# Patient Record
Sex: Male | Born: 1955
Health system: Southern US, Community
[De-identification: ages and names within clinical notes are randomized; demographics above are authoritative.]

## PROBLEM LIST (undated history)

## (undated) DIAGNOSIS — U071 COVID-19: Secondary | ICD-10-CM

## (undated) DIAGNOSIS — J449 Chronic obstructive pulmonary disease, unspecified: Secondary | ICD-10-CM

## (undated) DIAGNOSIS — I2510 Atherosclerotic heart disease of native coronary artery without angina pectoris: Secondary | ICD-10-CM

## (undated) DIAGNOSIS — I509 Heart failure, unspecified: Secondary | ICD-10-CM

## (undated) DIAGNOSIS — E119 Type 2 diabetes mellitus without complications: Secondary | ICD-10-CM

## (undated) DIAGNOSIS — G4730 Sleep apnea, unspecified: Secondary | ICD-10-CM

## (undated) DIAGNOSIS — I10 Essential (primary) hypertension: Secondary | ICD-10-CM

## (undated) HISTORY — DX: Sleep apnea, unspecified: G47.30

## (undated) HISTORY — PX: CHOLECYSTECTOMY: SHX55

---

## 2006-05-26 ENCOUNTER — Ambulatory Visit: Payer: Self-pay | Admitting: Cardiovascular Disease

## 2006-05-27 ENCOUNTER — Ambulatory Visit: Payer: Self-pay | Admitting: Internal Medicine

## 2006-05-27 ENCOUNTER — Other Ambulatory Visit: Payer: Self-pay

## 2006-07-10 ENCOUNTER — Ambulatory Visit: Payer: Self-pay | Admitting: Orthopedic Surgery

## 2007-04-19 ENCOUNTER — Ambulatory Visit: Payer: Self-pay | Admitting: Gastroenterology

## 2007-10-04 ENCOUNTER — Emergency Department: Payer: Self-pay | Admitting: Emergency Medicine

## 2007-10-04 ENCOUNTER — Other Ambulatory Visit: Payer: Self-pay

## 2009-08-16 ENCOUNTER — Other Ambulatory Visit: Payer: Self-pay | Admitting: Nephrology

## 2009-08-22 ENCOUNTER — Ambulatory Visit: Payer: Self-pay | Admitting: Nephrology

## 2009-12-07 ENCOUNTER — Ambulatory Visit: Payer: Self-pay | Admitting: Cardiovascular Disease

## 2010-10-25 ENCOUNTER — Ambulatory Visit: Payer: Self-pay

## 2012-04-03 IMAGING — CT CT HEAD WITHOUT CONTRAST
2 series · 16 of 30 positions shown, 20 images · non-contrast
Comparison: none

REASON FOR EXAM: ADD ON CR  [id number]  severe HA
COMMENTS:

[Series 2: without · axial · non-contrast · 0.44mm/px · z∈[-188,-63]mm · 13 of 31 slices shown, 17 images]
[im 3/31  brain]
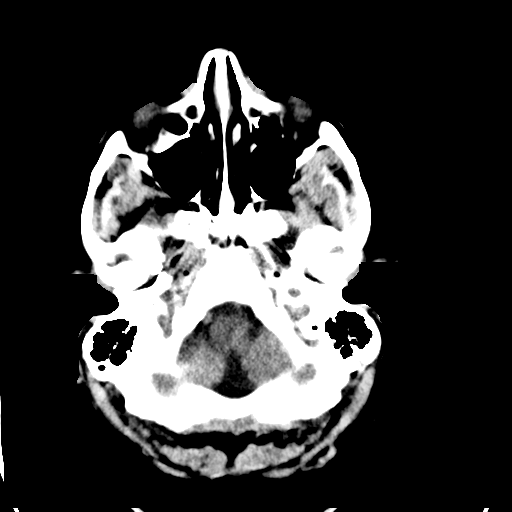
[im 3/31  bone]
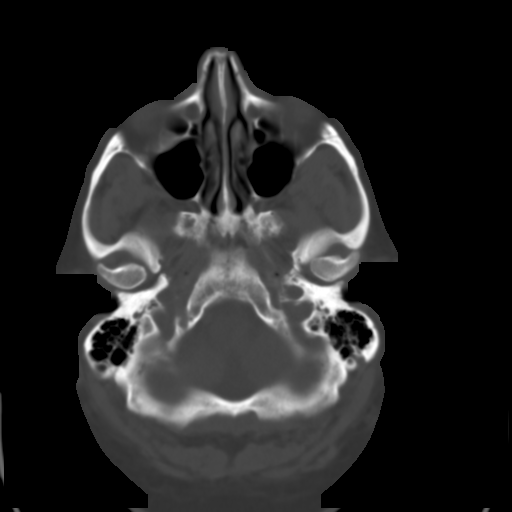
[im 5/31  brain]
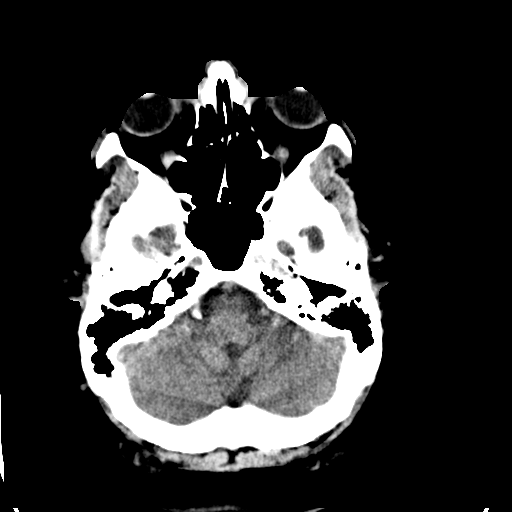
[im 7/31  brain]
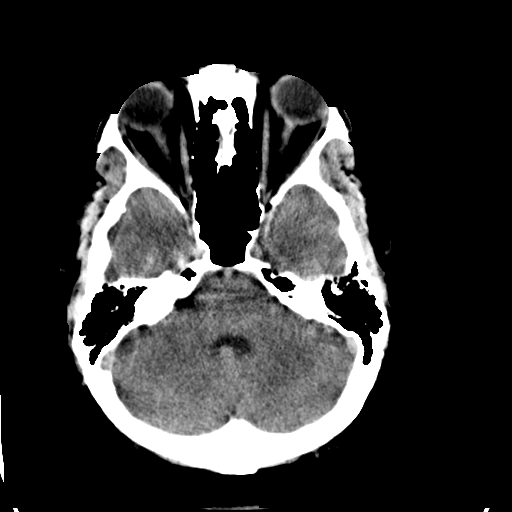
[im 9/31  brain]
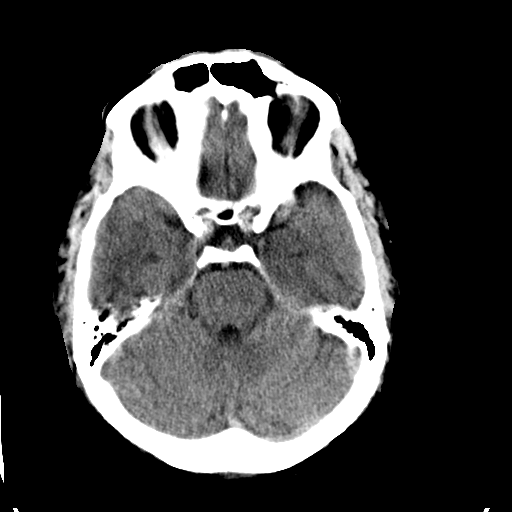
[im 11/31  brain]
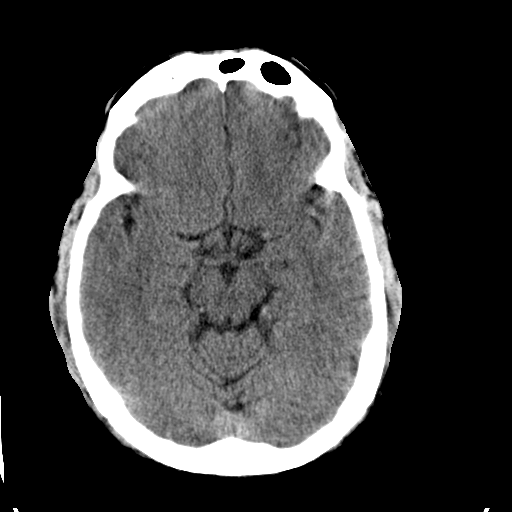
[im 11/31  bone]
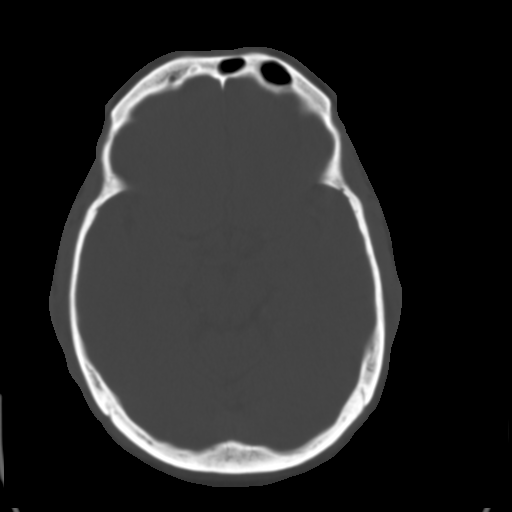
[im 13/31  brain]
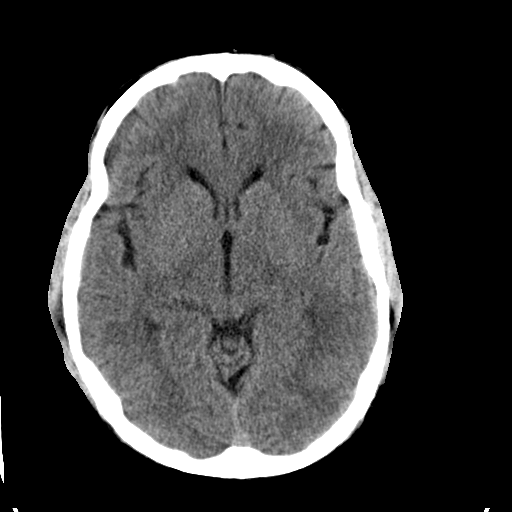
[im 16/31  brain]
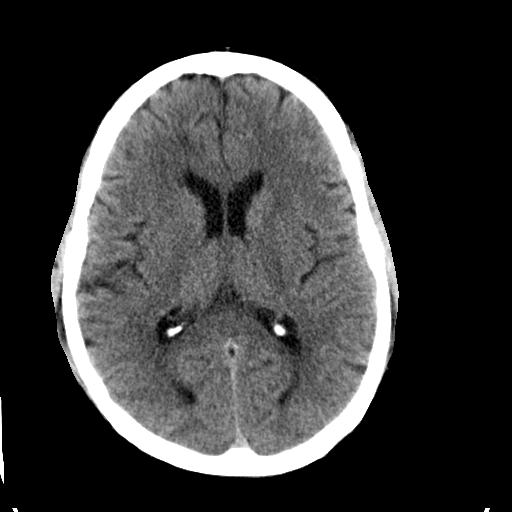
[im 18/31  brain]
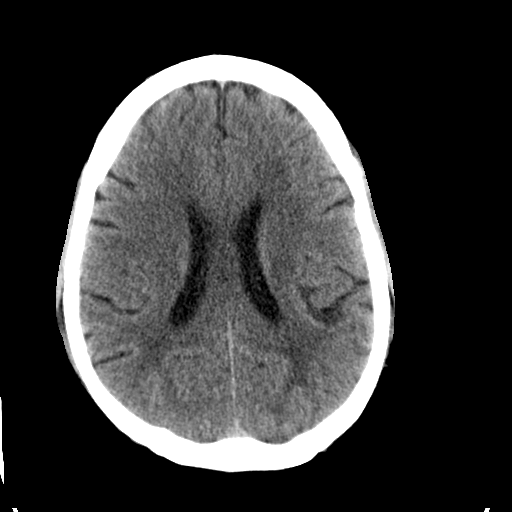
[im 20/31  brain]
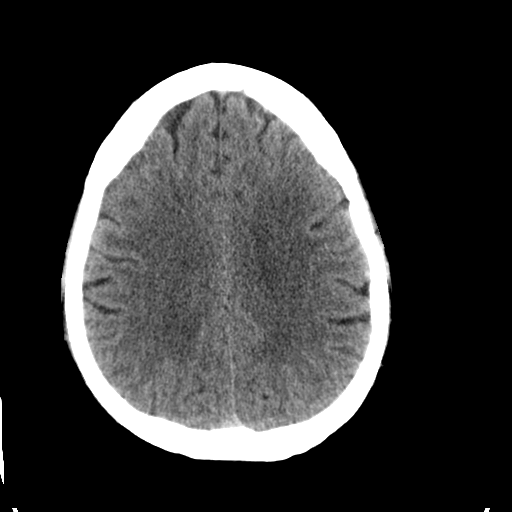
[im 20/31  bone]
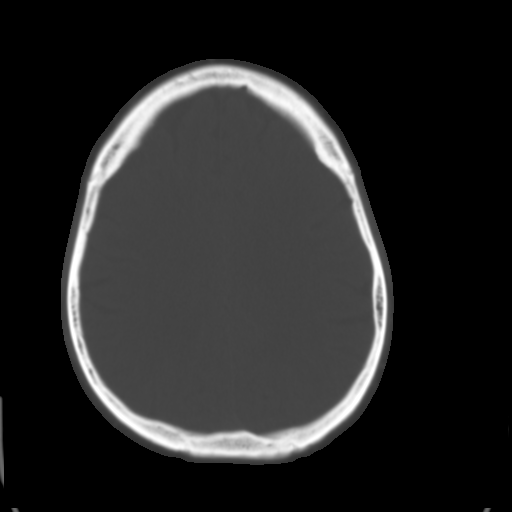
[im 22/31  brain]
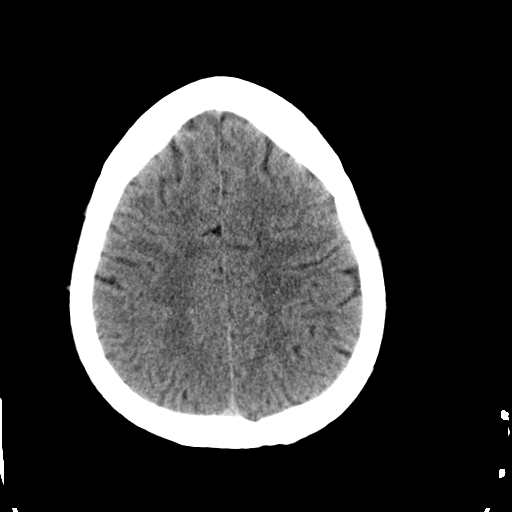
[im 24/31  brain]
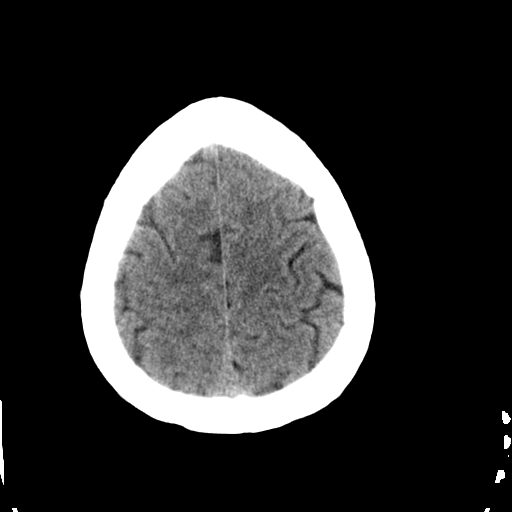
[im 26/31  brain]
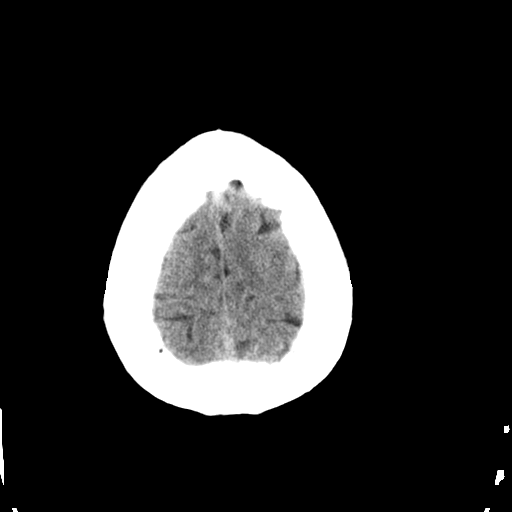
[im 28/31  brain]
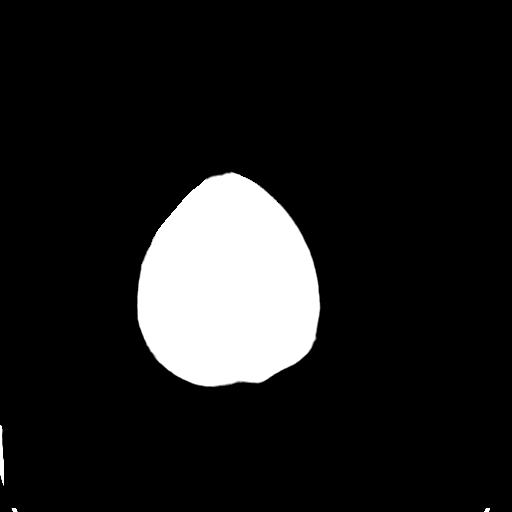
[im 28/31  bone]
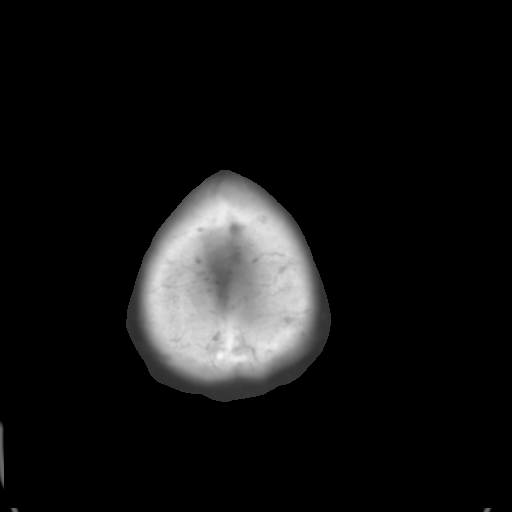

[Series 3: bone · axial · 0.44mm/px · z∈[-188,-148]mm · 3 of 31 slices shown]
[im 3/31  bone]
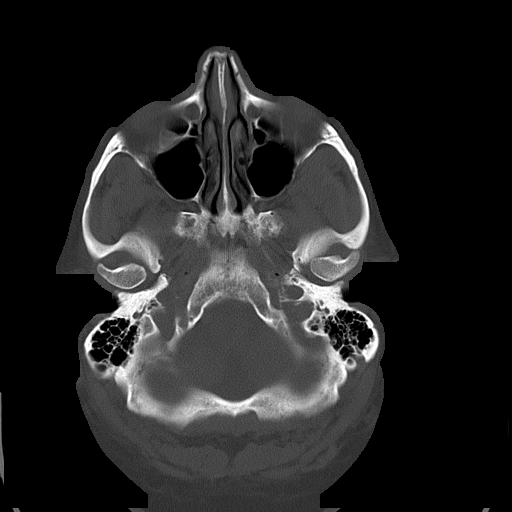
[im 7/31  bone]
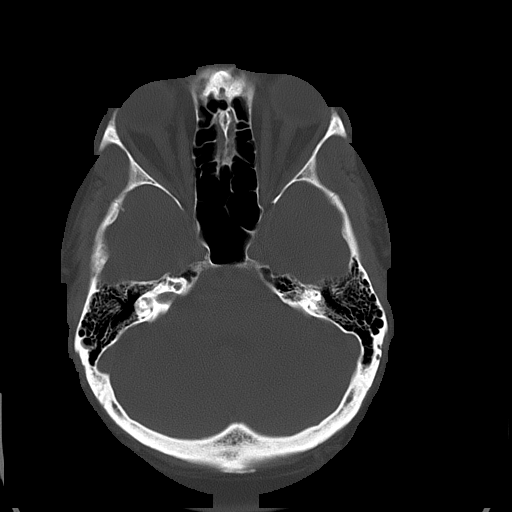
[im 11/31  bone]
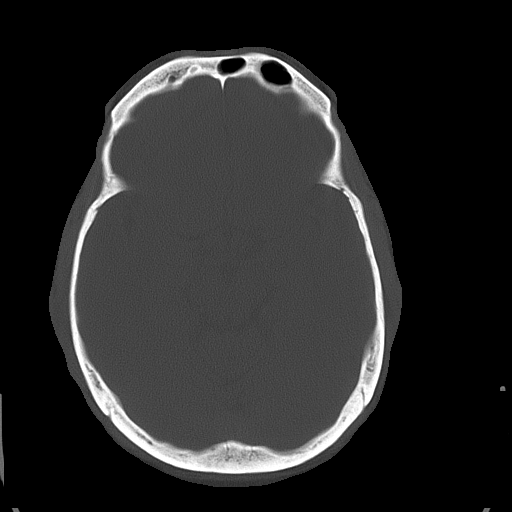

[16 of 30 positions shown; findings below may reference images not displayed]

PROCEDURE:     CT  - CT HEAD WITHOUT CONTRAST  - October 25, 2010 [DATE]

RESULT:     Emergent noncontrast CT of the brain is performed. There is some
mild low-attenuation diffusely within the periventricular and subcortical
white matter regions suggestive of minimal chronic small vessel ischemic
disease. There is no evidence of intracranial hemorrhage, edema, mass effect
or midline shift. There is no focal infarct. The calvarium is intact. The
sinuses and mastoids are clear.
IMPRESSION: 1. Changes of minimal chronic small vessel ischemic disease. No acute
intracranial abnormality evident.

## 2012-08-31 ENCOUNTER — Emergency Department: Payer: Self-pay | Admitting: Emergency Medicine

## 2012-08-31 LAB — URINALYSIS, COMPLETE
Bilirubin,UR: NEGATIVE
Blood: NEGATIVE
Hyaline Cast: 12
Nitrite: NEGATIVE
Ph: 5 (ref 4.5–8.0)
Specific Gravity: 1.028 (ref 1.003–1.030)
WBC UR: 5 /HPF (ref 0–5)

## 2012-08-31 LAB — COMPREHENSIVE METABOLIC PANEL
Albumin: 3.6 g/dL (ref 3.4–5.0)
Alkaline Phosphatase: 110 U/L (ref 50–136)
Anion Gap: 10 (ref 7–16)
BUN: 20 mg/dL — ABNORMAL HIGH (ref 7–18)
Bilirubin,Total: 0.9 mg/dL (ref 0.2–1.0)
Calcium, Total: 8.8 mg/dL (ref 8.5–10.1)
Creatinine: 1.44 mg/dL — ABNORMAL HIGH (ref 0.60–1.30)
EGFR (African American): >60
EGFR (Non-African Amer.): 54 — ABNORMAL LOW
Osmolality: 290 (ref 275–301)
Potassium: 3.3 mmol/L — ABNORMAL LOW (ref 3.5–5.1)
SGOT(AST): 31 U/L (ref 15–37)
SGPT (ALT): 43 U/L (ref 12–78)
Sodium: 131 mmol/L — ABNORMAL LOW (ref 136–145)
Total Protein: 7.4 g/dL (ref 6.4–8.2)

## 2012-08-31 LAB — CBC
HCT: 43.7 % (ref 40.0–52.0)
HGB: 14.6 g/dL (ref 13.0–18.0)
MCH: 28.2 pg (ref 26.0–34.0)
MCHC: 33.4 g/dL (ref 32.0–36.0)
MCV: 84 fL (ref 80–100)
WBC: 8.5 10*3/uL (ref 3.8–10.6)

## 2013-04-28 ENCOUNTER — Ambulatory Visit: Payer: Self-pay

## 2019-03-13 ENCOUNTER — Emergency Department: Payer: Medicare Other

## 2019-03-13 ENCOUNTER — Inpatient Hospital Stay
Admission: EM | Admit: 2019-03-13 | Discharge: 2019-03-17 | DRG: 291 | Disposition: A | Discharge status: Home / Self Care | Payer: Medicare Other | Attending: Internal Medicine | Admitting: Internal Medicine

## 2019-03-13 ENCOUNTER — Other Ambulatory Visit: Payer: Self-pay

## 2019-03-13 DIAGNOSIS — I5043 Acute on chronic combined systolic (congestive) and diastolic (congestive) heart failure: Secondary | ICD-10-CM | POA: Diagnosis present

## 2019-03-13 DIAGNOSIS — Z9119 Patient's noncompliance with other medical treatment and regimen: Secondary | ICD-10-CM

## 2019-03-13 DIAGNOSIS — R0602 Shortness of breath: Secondary | ICD-10-CM | POA: Diagnosis not present

## 2019-03-13 DIAGNOSIS — J449 Chronic obstructive pulmonary disease, unspecified: Secondary | ICD-10-CM | POA: Diagnosis present

## 2019-03-13 DIAGNOSIS — N183 Chronic kidney disease, stage 3 unspecified: Secondary | ICD-10-CM | POA: Diagnosis present

## 2019-03-13 DIAGNOSIS — R079 Chest pain, unspecified: Secondary | ICD-10-CM | POA: Diagnosis not present

## 2019-03-13 DIAGNOSIS — E785 Hyperlipidemia, unspecified: Secondary | ICD-10-CM | POA: Diagnosis present

## 2019-03-13 DIAGNOSIS — Z7984 Long term (current) use of oral hypoglycemic drugs: Secondary | ICD-10-CM | POA: Diagnosis not present

## 2019-03-13 DIAGNOSIS — I509 Heart failure, unspecified: Secondary | ICD-10-CM

## 2019-03-13 DIAGNOSIS — I10 Essential (primary) hypertension: Secondary | ICD-10-CM | POA: Diagnosis not present

## 2019-03-13 DIAGNOSIS — I4891 Unspecified atrial fibrillation: Secondary | ICD-10-CM | POA: Diagnosis not present

## 2019-03-13 DIAGNOSIS — G4730 Sleep apnea, unspecified: Secondary | ICD-10-CM | POA: Diagnosis present

## 2019-03-13 DIAGNOSIS — I110 Hypertensive heart disease with heart failure: Secondary | ICD-10-CM | POA: Diagnosis not present

## 2019-03-13 DIAGNOSIS — I480 Paroxysmal atrial fibrillation: Secondary | ICD-10-CM | POA: Diagnosis present

## 2019-03-13 DIAGNOSIS — I5033 Acute on chronic diastolic (congestive) heart failure: Secondary | ICD-10-CM | POA: Diagnosis present

## 2019-03-13 DIAGNOSIS — I472 Ventricular tachycardia: Secondary | ICD-10-CM | POA: Diagnosis not present

## 2019-03-13 DIAGNOSIS — R0600 Dyspnea, unspecified: Secondary | ICD-10-CM | POA: Diagnosis not present

## 2019-03-13 DIAGNOSIS — I2510 Atherosclerotic heart disease of native coronary artery without angina pectoris: Secondary | ICD-10-CM | POA: Diagnosis present

## 2019-03-13 DIAGNOSIS — Z8249 Family history of ischemic heart disease and other diseases of the circulatory system: Secondary | ICD-10-CM

## 2019-03-13 DIAGNOSIS — Z79899 Other long term (current) drug therapy: Secondary | ICD-10-CM

## 2019-03-13 DIAGNOSIS — E1122 Type 2 diabetes mellitus with diabetic chronic kidney disease: Secondary | ICD-10-CM | POA: Diagnosis present

## 2019-03-13 DIAGNOSIS — I130 Hypertensive heart and chronic kidney disease with heart failure and stage 1 through stage 4 chronic kidney disease, or unspecified chronic kidney disease: Secondary | ICD-10-CM | POA: Diagnosis not present

## 2019-03-13 DIAGNOSIS — R0601 Orthopnea: Secondary | ICD-10-CM | POA: Diagnosis present

## 2019-03-13 DIAGNOSIS — Z20828 Contact with and (suspected) exposure to other viral communicable diseases: Secondary | ICD-10-CM | POA: Diagnosis present

## 2019-03-13 DIAGNOSIS — E876 Hypokalemia: Secondary | ICD-10-CM | POA: Diagnosis not present

## 2019-03-13 DIAGNOSIS — E119 Type 2 diabetes mellitus without complications: Secondary | ICD-10-CM | POA: Diagnosis not present

## 2019-03-13 DIAGNOSIS — G4733 Obstructive sleep apnea (adult) (pediatric): Secondary | ICD-10-CM | POA: Diagnosis not present

## 2019-03-13 HISTORY — DX: Atherosclerotic heart disease of native coronary artery without angina pectoris: I25.10

## 2019-03-13 HISTORY — DX: Heart failure, unspecified: I50.9

## 2019-03-13 HISTORY — DX: Essential (primary) hypertension: I10

## 2019-03-13 HISTORY — DX: Type 2 diabetes mellitus without complications: E11.9

## 2019-03-13 HISTORY — DX: Chronic obstructive pulmonary disease, unspecified: J44.9

## 2019-03-13 LAB — CBC WITH DIFFERENTIAL/PLATELET
Abs Immature Granulocytes: 0.05 10*3/uL (ref 0.00–0.07)
Basophils Absolute: 0.1 10*3/uL (ref 0.0–0.1)
Basophils Relative: 1 %
Eosinophils Absolute: 0.2 10*3/uL (ref 0.0–0.5)
Eosinophils Relative: 2 %
HCT: 39 % (ref 39.0–52.0)
Hemoglobin: 13.1 g/dL (ref 13.0–17.0)
Immature Granulocytes: 1 %
Lymphocytes Relative: 20 %
Lymphs Abs: 2 10*3/uL (ref 0.7–4.0)
MCH: 27.9 pg (ref 26.0–34.0)
MCHC: 33.6 g/dL (ref 30.0–36.0)
MCV: 83 fL (ref 80.0–100.0)
Monocytes Absolute: 0.7 10*3/uL (ref 0.1–1.0)
Monocytes Relative: 7 %
Neutro Abs: 6.9 10*3/uL (ref 1.7–7.7)
Neutrophils Relative %: 69 %
Platelets: 280 10*3/uL (ref 150–400)
RBC: 4.7 MIL/uL (ref 4.22–5.81)
RDW: 14 % (ref 11.5–15.5)
WBC: 9.8 10*3/uL (ref 4.0–10.5)
nRBC: 0 % (ref 0.0–0.2)

## 2019-03-13 LAB — TROPONIN I (HIGH SENSITIVITY)
Troponin I (High Sensitivity): 13 ng/L (ref <18)
Troponin I (High Sensitivity): 14 ng/L (ref <18)
Troponin I (High Sensitivity): 16 ng/L (ref <18)

## 2019-03-13 LAB — COMPREHENSIVE METABOLIC PANEL
ALT: 29 U/L (ref 0–44)
AST: 21 U/L (ref 15–41)
Albumin: 4.2 g/dL (ref 3.5–5.0)
Alkaline Phosphatase: 72 U/L (ref 38–126)
Anion gap: 11 (ref 5–15)
BUN: 22 mg/dL (ref 8–23)
CO2: 24 mmol/L (ref 22–32)
Calcium: 9 mg/dL (ref 8.9–10.3)
Chloride: 105 mmol/L (ref 98–111)
Creatinine, Ser: 1.37 mg/dL — ABNORMAL HIGH (ref 0.61–1.24)
GFR calc Af Amer: >60 mL/min (ref >60)
GFR calc non Af Amer: 54 mL/min — ABNORMAL LOW (ref >60)
Glucose, Bld: 87 mg/dL (ref 70–99)
Potassium: 3.5 mmol/L (ref 3.5–5.1)
Sodium: 140 mmol/L (ref 135–145)
Total Bilirubin: 1.5 mg/dL — ABNORMAL HIGH (ref 0.3–1.2)
Total Protein: 7.7 g/dL (ref 6.5–8.1)

## 2019-03-13 LAB — BRAIN NATRIURETIC PEPTIDE: B Natriuretic Peptide: 476 pg/mL — ABNORMAL HIGH (ref 0.0–100.0)

## 2019-03-13 LAB — GLUCOSE, CAPILLARY: Glucose-Capillary: 87 mg/dL (ref 70–99)

## 2019-03-13 LAB — SARS CORONAVIRUS 2 BY RT PCR (HOSPITAL ORDER, PERFORMED IN ~~LOC~~ HOSPITAL LAB): SARS Coronavirus 2: NEGATIVE

## 2019-03-13 MED ORDER — HEPARIN SODIUM (PORCINE) 5000 UNIT/ML IJ SOLN
5000.0000 [IU] | Freq: Three times a day (TID) | INTRAMUSCULAR | Status: DC
  Administered 2019-03-13: 22:00:00 5000 [IU] via SUBCUTANEOUS
  Filled 2019-03-13: qty 1

## 2019-03-13 MED ORDER — SODIUM CHLORIDE 0.9% FLUSH
3.0000 mL | Freq: Two times a day (BID) | INTRAVENOUS | Status: DC
  Administered 2019-03-13 – 2019-03-17 (×6): 3 mL via INTRAVENOUS

## 2019-03-13 MED ORDER — POTASSIUM CHLORIDE 20 MEQ PO PACK
20.0000 meq | PACK | Freq: Two times a day (BID) | ORAL | Status: DC
  Administered 2019-03-13 – 2019-03-14 (×2): 20 meq via ORAL
  Filled 2019-03-13 (×2): qty 1

## 2019-03-13 MED ORDER — INSULIN ASPART 100 UNIT/ML ~~LOC~~ SOLN
0.0000 [IU] | Freq: Three times a day (TID) | SUBCUTANEOUS | Status: DC
  Administered 2019-03-14 (×2): 1 [IU] via SUBCUTANEOUS
  Administered 2019-03-14 – 2019-03-15 (×2): 2 [IU] via SUBCUTANEOUS
  Administered 2019-03-15: 09:00:00 1 [IU] via SUBCUTANEOUS
  Administered 2019-03-15 – 2019-03-16 (×3): 2 [IU] via SUBCUTANEOUS
  Administered 2019-03-16 – 2019-03-17 (×2): 1 [IU] via SUBCUTANEOUS
  Administered 2019-03-17: 2 [IU] via SUBCUTANEOUS
  Filled 2019-03-13 (×11): qty 1

## 2019-03-13 MED ORDER — SODIUM CHLORIDE 0.9% FLUSH
3.0000 mL | INTRAVENOUS | Status: DC | PRN
  Administered 2019-03-14: 23:00:00 3 mL via INTRAVENOUS
  Filled 2019-03-13: qty 3

## 2019-03-13 MED ORDER — METFORMIN HCL 500 MG PO TABS
1000.0000 mg | ORAL_TABLET | Freq: Two times a day (BID) | ORAL | Status: DC
  Administered 2019-03-14: 09:00:00 1000 mg via ORAL
  Filled 2019-03-13: qty 2

## 2019-03-13 MED ORDER — GLIPIZIDE 5 MG PO TABS: 5.0000 mg | ORAL_TABLET | Freq: Two times a day (BID) | ORAL | Status: DC

## 2019-03-13 MED ORDER — ACETAMINOPHEN 325 MG PO TABS: 650.0000 mg | ORAL_TABLET | ORAL | Status: DC | PRN

## 2019-03-13 MED ORDER — INSULIN ASPART 100 UNIT/ML ~~LOC~~ SOLN
0.0000 [IU] | Freq: Every day | SUBCUTANEOUS | Status: DC
  Administered 2019-03-16: 2 [IU] via SUBCUTANEOUS
  Filled 2019-03-13: qty 1

## 2019-03-13 MED ORDER — FUROSEMIDE 10 MG/ML IJ SOLN
20.0000 mg | Freq: Once | INTRAMUSCULAR | Status: AC
  Administered 2019-03-13: 19:00:00 20 mg via INTRAVENOUS
  Filled 2019-03-13: qty 4

## 2019-03-13 MED ORDER — ATORVASTATIN CALCIUM 20 MG PO TABS
20.0000 mg | ORAL_TABLET | Freq: Every day | ORAL | Status: DC
  Administered 2019-03-13 – 2019-03-16 (×4): 20 mg via ORAL
  Filled 2019-03-13 (×4): qty 1

## 2019-03-13 MED ORDER — HYDRALAZINE HCL 50 MG PO TABS
100.0000 mg | ORAL_TABLET | Freq: Two times a day (BID) | ORAL | Status: DC
  Administered 2019-03-13 – 2019-03-15 (×4): 100 mg via ORAL
  Filled 2019-03-13 (×4): qty 2

## 2019-03-13 MED ORDER — FUROSEMIDE 10 MG/ML IJ SOLN
20.0000 mg | Freq: Two times a day (BID) | INTRAMUSCULAR | Status: DC
  Administered 2019-03-13 – 2019-03-14 (×2): 20 mg via INTRAVENOUS
  Filled 2019-03-13 (×2): qty 2

## 2019-03-13 MED ORDER — ISOSORBIDE MONONITRATE ER 30 MG PO TB24
30.0000 mg | ORAL_TABLET | Freq: Two times a day (BID) | ORAL | Status: DC
  Administered 2019-03-13 – 2019-03-17 (×8): 30 mg via ORAL
  Filled 2019-03-13 (×8): qty 1

## 2019-03-13 MED ORDER — LOSARTAN POTASSIUM 50 MG PO TABS
100.0000 mg | ORAL_TABLET | Freq: Every day | ORAL | Status: DC
  Administered 2019-03-14: 100 mg via ORAL
  Filled 2019-03-13: qty 2

## 2019-03-13 MED ORDER — SODIUM CHLORIDE 0.9 % IV SOLN: 250.0000 mL | INTRAVENOUS | Status: DC | PRN

## 2019-03-13 MED ORDER — ONDANSETRON HCL 4 MG/2ML IJ SOLN: 4.0000 mg | Freq: Four times a day (QID) | INTRAMUSCULAR | Status: DC | PRN

## 2019-03-13 MED ORDER — ALLOPURINOL 100 MG PO TABS
100.0000 mg | ORAL_TABLET | Freq: Two times a day (BID) | ORAL | Status: DC
  Administered 2019-03-13 – 2019-03-17 (×8): 100 mg via ORAL
  Filled 2019-03-13 (×9): qty 1

## 2019-03-13 MED ORDER — CARVEDILOL 25 MG PO TABS
25.0000 mg | ORAL_TABLET | Freq: Two times a day (BID) | ORAL | Status: DC
  Administered 2019-03-14 – 2019-03-16 (×6): 25 mg via ORAL
  Filled 2019-03-13 (×7): qty 1

## 2019-03-13 NOTE — H&P (Signed)
[facility] [place] [facility]   PATIENT NAME: John Powell    MR#:  [id number]  DATE OF BIRTH:  03/13/1956  DATE OF ADMISSION:  03/13/2019  PRIMARY CARE PHYSICIAN: [facility]   REQUESTING/REFERRING PHYSICIAN: Malinda  CHIEF COMPLAINT:   Chief Complaint  Patient presents with  . Shortness of Breath    HISTORY OF PRESENT ILLNESS: John Powell  is a 63 y.o. male with a known history of congestive heart failure, COPD, coronary artery disease, diabetes, hypertension-has progressive worsening in his symptoms for last few month where he feels increasingly short of breath with minimal exertion and have palpitation feelings on and off.  He uses CPAP at night. For last 2-3 nights it is extremely difficult and he could not sleep at all in spite of using CPAP at night so decided to come to emergency room today.  He denies any chest pain but has feeling of palpitation repeatedly.  He denies any swelling on his legs. He claims to be taking all the medications regularly as advised by his physicians.  PAST MEDICAL HISTORY:   Past Medical History:  Diagnosis Date  . CHF (congestive heart failure) (HCC)   . COPD (chronic obstructive pulmonary disease) (HCC)   . Coronary artery disease   . Diabetes mellitus without complication (HCC)   . Hypertension     PAST SURGICAL HISTORY:  Past Surgical History:  Procedure Laterality Date  . CHOLECYSTECTOMY      SOCIAL HISTORY:  Social History   Tobacco Use  . Smoking status: Never Smoker  . Smokeless tobacco: Never Used  Substance Use Topics  . Alcohol use: Not on file    FAMILY HISTORY:  Family History  Problem Relation Age of Onset  . Hypertension Mother     DRUG ALLERGIES: No Known Allergies  REVIEW OF SYSTEMS:   CONSTITUTIONAL: No fever, fatigue or weakness.  EYES: No blurred or double vision.  EARS, NOSE, AND THROAT: No tinnitus or ear pain.  RESPIRATORY: No cough, have shortness of breath,  no wheezing or hemoptysis.  CARDIOVASCULAR: No chest pain, have orthopnea, edema.  He has palpitation complaints. GASTROINTESTINAL: No nausea, vomiting, diarrhea or abdominal pain.  GENITOURINARY: No dysuria, hematuria.  ENDOCRINE: No polyuria, nocturia,  HEMATOLOGY: No anemia, easy bruising or bleeding SKIN: No rash or lesion. MUSCULOSKELETAL: No joint pain or arthritis.   NEUROLOGIC: No tingling, numbness, weakness.  PSYCHIATRY: No anxiety or depression.   MEDICATIONS AT HOME:  Prior to Admission medications   Medication Sig Start Date End Date Taking? Authorizing Provider  allopurinol (ZYLOPRIM) 100 MG tablet Take 100 mg by mouth 2 (two) times daily.   Yes [provider]  atorvastatin (LIPITOR) 40 MG tablet Take 20 mg by mouth at bedtime.   Yes [provider]  B Complex-C (SUPER B COMPLEX PO) Take 1 tablet by mouth daily.   Yes [provider]  carvedilol (COREG) 25 MG tablet Take 25 mg by mouth 2 (two) times daily with a meal.   Yes [provider]  glipiZIDE (GLUCOTROL) 5 MG tablet Take 5 mg by mouth 2 (two) times daily.   Yes [provider]  hydrALAZINE (APRESOLINE) 100 MG tablet Take 100 mg by mouth 2 (two) times daily.   Yes [provider]  hydrochlorothiazide (HYDRODIURIL) 25 MG tablet Take 25 mg by mouth daily.   Yes [provider]  isosorbide mononitrate (IMDUR) 60 MG 24 hr tablet Take 30 mg by mouth 2 (two) times  daily.   Yes [provider]  losartan (COZAAR) 100 MG tablet Take 100 mg by mouth daily.   Yes [provider]  metFORMIN (GLUCOPHAGE) 1000 MG tablet Take 1,000 mg by mouth 2 (two) times daily with a meal.   Yes [provider]  potassium chloride (KLOR-CON) 20 MEQ packet Take 20 mEq by mouth 2 (two) times daily.   Yes [provider]      PHYSICAL EXAMINATION:   VITAL SIGNS: Blood pressure (!) 167/89, pulse 60, temperature 98.7 F (37.1 C), temperature source  Oral, resp. rate (!) 24, height 5\' 1"  (1.549 m), weight (!) 154.2 kg, SpO2 (!) 89 %.  GENERAL:  63 y.o.-year-old patient lying in the bed with no acute distress.  EYES: Pupils equal, round, reactive to light and accommodation. No scleral icterus. Extraocular muscles intact.  HEENT: Head atraumatic, normocephalic. Oropharynx and nasopharynx clear.  NECK:  Supple, no jugular venous distention. No thyroid enlargement, no tenderness.  LUNGS: Normal breath sounds bilaterally, no wheezing, have some crepitation. No use of accessory muscles of respiration.  CARDIOVASCULAR: S1, S2 normal. No murmurs, rubs, or gallops.  ABDOMEN: Soft, nontender, nondistended. Bowel sounds present. No organomegaly or mass.  EXTREMITIES: Some pedal edema, no cyanosis, or clubbing.  NEUROLOGIC: Cranial nerves II through XII are intact. Muscle strength 5/5 in all extremities. Sensation intact. Gait not checked.  PSYCHIATRIC: The patient is alert and oriented x 3.  SKIN: No obvious rash, lesion, or ulcer.   LABORATORY PANEL:   CBC Recent Labs  Lab 03/13/19 1710  WBC 9.8  HGB 13.1  HCT 39.0  PLT 280  MCV 83.0  MCH 27.9  MCHC 33.6  RDW 14.0  LYMPHSABS 2.0  MONOABS 0.7  EOSABS 0.2  BASOSABS 0.1   ------------------------------------------------------------------------------------------------------------------  Chemistries  Recent Labs  Lab 03/13/19 1710  NA 140  K 3.5  CL 105  CO2 24  GLUCOSE 87  BUN 22  CREATININE 1.37*  CALCIUM 9.0  AST 21  ALT 29  ALKPHOS 72  BILITOT 1.5*   ------------------------------------------------------------------------------------------------------------------ estimated creatinine clearance is 71.7 mL/min (A) (by C-G formula based on SCr of 1.37 mg/dL (H)). ------------------------------------------------------------------------------------------------------------------ No results for input(s): TSH, T4TOTAL, T3FREE, THYROIDAB in the last 72 hours.  Invalid  input(s): FREET3   Coagulation profile No results for input(s): INR, PROTIME in the last 168 hours. ------------------------------------------------------------------------------------------------------------------- No results for input(s): DDIMER in the last 72 hours. -------------------------------------------------------------------------------------------------------------------  Cardiac Enzymes No results for input(s): CKMB, TROPONINI, MYOGLOBIN in the last 168 hours.  Invalid input(s): CK ------------------------------------------------------------------------------------------------------------------ Invalid input(s): POCBNP  ---------------------------------------------------------------------------------------------------------------  Urinalysis    Component Value Date/Time   COLORURINE Yellow 08/31/2012 1722   APPEARANCEUR Clear 08/31/2012 1722   LABSPEC 1.028 08/31/2012 1722   PHURINE 5.0 08/31/2012 1722   GLUCOSEU >=500 08/31/2012 1722   HGBUR Negative 08/31/2012 1722   BILIRUBINUR Negative 08/31/2012 1722   KETONESUR 1+ 08/31/2012 1722   PROTEINUR Negative 08/31/2012 1722   NITRITE Negative 08/31/2012 1722   LEUKOCYTESUR Negative 08/31/2012 1722     RADIOLOGY: Dg Chest 2 View  Result Date: 03/13/2019 CLINICAL DATA:  Shortness of breath EXAM: CHEST - 2 VIEW COMPARISON:  10/04/2007 FINDINGS: Cardiomegaly with central vascular congestion. No consolidation or pleural effusion. No pneumothorax. IMPRESSION: Cardiomegaly with vascular congestion. Electronically Signed   By: Donavan Foil M.D.   On: 03/13/2019 17:32    EKG: Orders placed or performed during the hospital encounter of 03/13/19  . ED EKG  . ED EKG  . ED EKG  .  ED EKG    IMPRESSION AND PLAN:  *Acute diastolic congestive heart failure We do not have previous echocardiogram to compare I will monitor on telemetry and follow serial troponin. IV Lasix, monitor intake and output. Get  echocardiogram. Cardiology consult. Patient is already on beta-blockers, losartan, Imdur.  *Hypertension Continue home medications of beta-blocker, losartan, Imdur, hydralazine  *Diabetes Continue oral home meds and keep on sliding scale coverage.  *CKD stage III Appears to be stable, continue to monitor  *Sleep apnea Continue CPAP at night.  *Hyperlipidemia Continue atorvastatin.     All the records are reviewed and case discussed with ED provider. Management plans discussed with the patient, family and they are in agreement.  CODE STATUS: Full code.    TOTAL TIME TAKING CARE OF THIS PATIENT: 45 minutes.    Altamese DillingVaibhavkumar Greysin Medlen M.D on 03/13/2019   Between 7am to 6pm - Pager - [phone]  After 6pm go to [email or web address] - password [email or web address]  [facility]  Office  [phone]  CC: Primary care physician; [facility]   Note: This dictation was prepared with Dragon dictation along with smaller phrase technology. Any transcriptional errors that result from this process are unintentional.

## 2019-03-13 NOTE — ED Triage Notes (Signed)
Pt presents via POV c/o SOB x1 month. Reports hx "heart problems" Reports orthopnea and DOE.

## 2019-03-13 NOTE — ED Notes (Signed)
First Nurse Note: Pt states that he feels short of breath and like he cannot get air. No respiratory distress noted at this time.

## 2019-03-13 NOTE — ED Notes (Signed)
Patient transported to X-ray 

## 2019-03-13 NOTE — ED Notes (Signed)
This RN transported pt to room 254.

## 2019-03-13 NOTE — Progress Notes (Signed)
Family Meeting Note  Advance Directive:yes  Today a meeting took place with the Patient.   The following clinical team members were present during this meeting:MD  The following were discussed:Patient's diagnosis: CHF, diabetes, hypertension, hyperlipidemia, sleep apnea, Patient's progosis: Unable to determine and Goals for treatment: Full Code  Additional follow-up to be provided: Cardiology  Time spent during discussion:20 minutes  Vaughan Basta, MD

## 2019-03-13 NOTE — ED Notes (Signed)
ED TO INPATIENT HANDOFF REPORT  ED Nurse Name and Phone #: Betti Cruz Name/Age/Gender John Powell 63 y.o. male Room/Bed: ED16A/ED16A  Code Status   Code Status: Not on file  Home/SNF/Other Home Patient oriented to: self, place, time and situation Is this baseline? Yes   Triage Complete: Triage complete  Chief Complaint SOB  Triage Note Pt presents via POV c/o SOB x1 month. Reports hx "heart problems" Reports orthopnea and DOE.    Allergies No Known Allergies  Level of Care/Admitting Diagnosis ED Disposition    ED Disposition Condition Comment   Admit  Hospital Area: [facility] [[id number]]  Level of Care: Telemetry [5]  Covid Evaluation: Asymptomatic Screening Protocol (No Symptoms)  Diagnosis: Acute on chronic diastolic CHF (congestive heart failure) [facility]) [161096]  Admitting Physician: Altamese Dilling [phone]  Attending Physician: Altamese Dilling [phone]  Estimated length of stay: past midnight tomorrow  Certification:: I certify this patient will need inpatient services for at least 2 midnights  PT Class (Do Not Modify): Inpatient [101]  PT Acc Code (Do Not Modify): Private [1]       B Medical/Surgery History Past Medical History:  Diagnosis Date  . CHF (congestive heart failure) (HCC)   . COPD (chronic obstructive pulmonary disease) (HCC)   . Coronary artery disease   . Diabetes mellitus without complication (HCC)   . Hypertension    Past Surgical History:  Procedure Laterality Date  . CHOLECYSTECTOMY       A IV Location/Drains/Wounds Patient Lines/Drains/Airways Status   Active Line/Drains/Airways    None          Intake/Output Last 24 hours No intake or output data in the 24 hours ending 03/13/19 2120  Labs/Imaging Results for orders placed or performed during the hospital encounter of 03/13/19 (from the past 48 hour(s))  Brain natriuretic peptide     Status: Abnormal   Collection Time: 03/13/19   5:07 PM  Result Value Ref Range   B Natriuretic Peptide 476.0 (H) 0.0 - 100.0 pg/mL    Comment: Performed at [facility], [address] [place], [place] [address]  Comprehensive metabolic panel     Status: Abnormal   Collection Time: 03/13/19  5:10 PM  Result Value Ref Range   Sodium 140 135 - 145 mmol/L   Potassium 3.5 3.5 - 5.1 mmol/L   Chloride 105 98 - 111 mmol/L   CO2 24 22 - 32 mmol/L   Glucose, Bld 87 70 - 99 mg/dL   BUN 22 8 - 23 mg/dL   Creatinine, Ser 1.30 (H) 0.61 - 1.24 mg/dL   Calcium 9.0 8.9 - 86.5 mg/dL   Total Protein 7.7 6.5 - 8.1 g/dL   Albumin 4.2 3.5 - 5.0 g/dL   AST 21 15 - 41 U/L   ALT 29 0 - 44 U/L   Alkaline Phosphatase 72 38 - 126 U/L   Total Bilirubin 1.5 (H) 0.3 - 1.2 mg/dL   GFR calc non Af Amer 54 (L) >60 mL/min   GFR calc Af Amer >60 >60 mL/min   Anion gap 11 5 - 15    Comment: Performed at [facility], [address] [place], [place] [address]  Troponin I (High Sensitivity)     Status: None   Collection Time: 03/13/19  5:10 PM  Result Value Ref Range   Troponin I (High Sensitivity) 13 <18 ng/L    Comment: (NOTE) Elevated high sensitivity troponin I (hsTnI) values and significant  changes  across serial measurements may suggest ACS but many other  chronic and acute conditions are known to elevate hsTnI results.  Refer to the "Links" section for chest pain algorithms and additional  guidance. Performed at [facility], [address] [place], [place] [address]   CBC with Differential     Status: None   Collection Time: 03/13/19  5:10 PM  Result Value Ref Range   WBC 9.8 4.0 - 10.5 K/uL   RBC 4.70 4.22 - 5.81 MIL/uL   Hemoglobin 13.1 13.0 - 17.0 g/dL   HCT 98.9 21.1 - 94.1 %   MCV 83.0 80.0 - 100.0 fL   MCH 27.9 26.0 - 34.0 pg   MCHC 33.6 30.0 - 36.0 g/dL   RDW 74.0 81.4 - 48.1 %   Platelets 280 150 - 400 K/uL   nRBC 0.0 0.0 - 0.2 %   Neutrophils Relative % 69 %   Neutro Abs 6.9 1.7 - 7.7 K/uL    Lymphocytes Relative 20 %   Lymphs Abs 2.0 0.7 - 4.0 K/uL   Monocytes Relative 7 %   Monocytes Absolute 0.7 0.1 - 1.0 K/uL   Eosinophils Relative 2 %   Eosinophils Absolute 0.2 0.0 - 0.5 K/uL   Basophils Relative 1 %   Basophils Absolute 0.1 0.0 - 0.1 K/uL   Immature Granulocytes 1 %   Abs Immature Granulocytes 0.05 0.00 - 0.07 K/uL    Comment: Performed at [facility], [address], [place], [place] [address]  Troponin I (High Sensitivity)     Status: None   Collection Time: 03/13/19  7:21 PM  Result Value Ref Range   Troponin I (High Sensitivity) 14 <18 ng/L    Comment: (NOTE) Elevated high sensitivity troponin I (hsTnI) values and significant  changes across serial measurements may suggest ACS but many other  chronic and acute conditions are known to elevate hsTnI results.  Refer to the "Links" section for chest pain algorithms and additional  guidance. Performed at [facility], [address] [place], [place] [address]   SARS Coronavirus 2 [facility] order, Performed in [facility] hospital lab) Nasopharyngeal Nasopharyngeal Swab     Status: None   Collection Time: 03/13/19  7:21 PM   Specimen: Nasopharyngeal Swab  Result Value Ref Range   SARS Coronavirus 2 NEGATIVE NEGATIVE    Comment: (NOTE) If result is NEGATIVE SARS-CoV-2 target nucleic acids are NOT DETECTED. The SARS-CoV-2 RNA is generally detectable in upper and lower  respiratory specimens during the acute phase of infection. The lowest  concentration of SARS-CoV-2 viral copies this assay can detect is 250  copies / mL. A negative result does not preclude SARS-CoV-2 infection  and should not be used as the sole basis for treatment or other  patient management decisions.  A negative result may occur with  improper specimen collection / handling, submission of specimen other  than nasopharyngeal swab, presence of viral mutation(s) within the  areas targeted by this assay, and inadequate  number of viral copies  (<250 copies / mL). A negative result must be combined with clinical  observations, patient history, and epidemiological information. If result is POSITIVE SARS-CoV-2 target nucleic acids are DETECTED. The SARS-CoV-2 RNA is generally detectable in upper and lower  respiratory specimens dur ing the acute phase of infection.  Positive  results are indicative of active infection with SARS-CoV-2.  Clinical  correlation with patient history and other diagnostic information is  necessary to determine patient infection status.  Positive results do  not rule out bacterial infection or co-infection with other viruses. If result is PRESUMPTIVE POSTIVE SARS-CoV-2 nucleic acids MAY BE PRESENT.   A presumptive positive result was obtained on the submitted specimen  and confirmed on repeat testing.  While 2019 novel coronavirus  (SARS-CoV-2) nucleic acids may be present in the submitted sample  additional confirmatory testing may be necessary for epidemiological  and / or clinical management purposes  to differentiate between  SARS-CoV-2 and other Sarbecovirus currently known to infect humans.  If clinically indicated additional testing with an alternate test  methodology [phone]) is advised. The SARS-CoV-2 RNA is generally  detectable in upper and lower respiratory sp ecimens during the acute  phase of infection. The expected result is Negative. Fact Sheet for Patients:  StrictlyIdeas.no Fact Sheet for Healthcare Providers: BankingDealers.co.za This test is not yet approved or cleared by the [place] FDA and has been authorized for detection and/or diagnosis of SARS-CoV-2 by FDA under an Emergency Use Authorization (EUA).  This EUA will remain in effect (meaning this test can be used) for the duration of the COVID-19 declaration under Section 564(b)(1) of the Act, 21 U.S.C. section 360bbb-3(b)(1), unless the  authorization is terminated or revoked sooner. Performed at [facility], [address] [place], [place] [address]    Dg Chest 2 View  Result Date: 03/13/2019 CLINICAL DATA:  Shortness of breath EXAM: CHEST - 2 VIEW COMPARISON:  10/04/2007 FINDINGS: Cardiomegaly with central vascular congestion. No consolidation or pleural effusion. No pneumothorax. IMPRESSION: Cardiomegaly with vascular congestion. Electronically Signed   By: Donavan Foil M.D.   On: 03/13/2019 17:32    Pending Labs Unresulted Labs (From admission, onward)    Start     Ordered   03/13/19 2015  Hemoglobin A1c  Once,   STAT    Comments: To assess prior glycemic control    03/13/19 2014   Signed and Held  HIV Antibody  (Routine Testing)  Once,   R     Signed and Held   Signed and Held  Basic metabolic panel  Daily,   R     Signed and Held   Signed and Held  CBC  (heparin)  Once,   R    Comments: Baseline for heparin therapy IF NOT ALREADY DRAWN.  Notify MD if PLT < 100 K.    Signed and Held   Signed and Held  Creatinine, serum  (heparin)  Once,   R    Comments: Baseline for heparin therapy IF NOT ALREADY DRAWN.    Signed and Held          Vitals/Pain Today's Vitals   03/13/19 1930 03/13/19 2000 03/13/19 2030 03/13/19 2100  BP: (!) 177/104 (!) 167/89 (!) 164/92 (!) 164/91  Pulse: 69 60 62 (!) 59  Resp: 20 (!) 24 20 (!) 21  Temp:      TempSrc:      SpO2: 92% (!) 89% 91% 95%  Weight:      Height:      PainSc:        Isolation Precautions No active isolations  Medications Medications  insulin aspart (novoLOG) injection 0-9 Units (has no administration in time range)  insulin aspart (novoLOG) injection 0-5 Units (has no administration in time range)  furosemide (LASIX) injection 20 mg (20 mg Intravenous Given 03/13/19 1841)    Mobility walks Low fall risk   Focused Assessments Pulmonary Assessment Handoff:  Lung sounds: Bilateral Breath Sounds: Clear L Breath  Sounds: Clear R  Breath Sounds: Clear O2 Device: Room Air        R Recommendations: See Admitting Provider Note  Report given to:   Additional Notes:

## 2019-03-13 NOTE — ED Notes (Signed)
This RN to call respiratory and let them know patient is ready for CPAP

## 2019-03-13 NOTE — ED Provider Notes (Signed)
[facility] Emergency Department Provider Note   ____________________________________________   First MD Initiated Contact with Patient 03/13/19 1644     (approximate)  I have reviewed the triage vital signs and the nursing notes.   HISTORY  Chief Complaint Shortness of Breath    HPI John Powell is a 63 y.o. male who reports increasing shortness of breath for several days.  Is got to the point where the last 2 days he is unable to lay down flat at night.  He wears CPAP but that is not helping either.  When he lays down after little bit he gets too short of breath and has to wake up and sit up.  He has some swelling in his legs he may be a little worse to.  He is not really having any chest pain but he does get short of breath with exertion as well.        Past Medical History:  Diagnosis Date  . COPD (chronic obstructive pulmonary disease) ([place])   . Coronary artery disease   . Diabetes mellitus without complication ([place])   . Hypertension     There are no active problems to display for this patient.   Past Surgical History:  Procedure Laterality Date  . CHOLECYSTECTOMY      Prior to Admission medications   Not on File    Allergies Patient has no known allergies.  History reviewed. No pertinent family history.  Social History Social History   Tobacco Use  . Smoking status: Never Smoker  . Smokeless tobacco: Never Used  Substance Use Topics  . Alcohol use: Not on file  . Drug use: Not on file    Review of Systems  Constitutional: No fever/chills Eyes: No visual changes. ENT: No sore throat. Cardiovascular: Denies chest pain. Respiratory:shortness of breath. Gastrointestinal: No abdominal pain.  No nausea, no vomiting.  No diarrhea.  No constipation. Genitourinary: Negative for dysuria. Musculoskeletal: Negative for back pain. Skin: Negative for rash. Neurological: Negative for headaches, focal weakness  o  ____________________________________________   PHYSICAL EXAM:  VITAL SIGNS: ED Triage Vitals  Enc Vitals Group     BP 03/13/19 1601 (!) 118/99     Pulse Rate 03/13/19 1601 64     Resp 03/13/19 1601 16     Temp 03/13/19 1601 98.7 F (37.1 C)     Temp Source 03/13/19 1601 Oral     SpO2 03/13/19 1601 96 %     Weight 03/13/19 1601 (!) 340 lb (154.2 kg)     Height 03/13/19 1653 5\' 1"  (1.549 m)     Head Circumference --      Peak Flow --      Pain Score 03/13/19 1601 6     Pain Loc --      Pain Edu? --      Excl. in [place]? --     Constitutional: Alert and oriented. Well appearing and in no acute distress. Eyes: Conjunctivae are normal.  Head: Atraumatic. Nose: No congestion/rhinnorhea. Mouth/Throat: Mucous membranes are moist.  Oropharynx non-erythematous. Neck: No stridor.   Cardiovascular: Normal rate, regular rhythm. Grossly normal heart sounds.  Good peripheral circulation. Respiratory: Normal respiratory effort.  No retractions. Lungs CTAB. Gastrointestinal: Soft and nontender. No distention. No abdominal bruits. No CVA tenderness. Musculoskeletal: No lower extremity tenderness bilateral edema.   Neurologic:  Normal speech and la Skin:  Skin is warm, dry and intact. No rash noted.  ____________________________________________   LABS (all labs ordered  are listed, but only abnormal results are displayed)  Labs Reviewed  COMPREHENSIVE METABOLIC PANEL - Abnormal; Notable for the following components:      Result Value   Creatinine, Ser 1.37 (*)    Total Bilirubin 1.5 (*)    GFR calc non Af Amer 54 (*)    All other components within normal limits  BRAIN NATRIURETIC PEPTIDE - Abnormal; Notable for the following components:   B Natriuretic Peptide 476.0 (*)    All other components within normal limits  CBC WITH DIFFERENTIAL/PLATELET  TROPONIN I (HIGH SENSITIVITY)  TROPONIN I (HIGH SENSITIVITY)   ____________________________________________  EKG EKG read  interpreted by me shows normal sinus rhythm rate of 68 normal axis decreased R wave progression nonspecific ST-T wave changes  ____________________________________________  RADIOLOGY  ED MD interpretation: Chest x-ray read by radiology reviewed by me shows cardiomegaly with CHF  Official radiology report(s): Dg Chest 2 View  Result Date: 03/13/2019 CLINICAL DATA:  Shortness of breath EXAM: CHEST - 2 VIEW COMPARISON:  10/04/2007 FINDINGS: Cardiomegaly with central vascular congestion. No consolidation or pleural effusion. No pneumothorax. IMPRESSION: Cardiomegaly with vascular congestion. Electronically Signed   By: Jasmine Pang M.D.   On: 03/13/2019 17:32    ____________________________________________   PROCEDURES  Procedure(s) performed (including Critical Care):  Procedures   ____________________________________________   INITIAL IMPRESSION / ASSESSMENT AND PLAN / ED COURSE John Powell was evaluated in Emergency Department on 03/13/2019 for the symptoms described in the history of present illness. He was evaluated in the context of the global COVID-19 pandemic, which necessitated consideration that the patient might be at risk for infection with the SARS-CoV-2 virus that causes COVID-19. Institutional protocols and algorithms that pertain to the evaluation of patients at risk for COVID-19 are in a state of rapid change based on information released by regulatory bodies including the CDC and federal and state organizations. These policies and algorithms were followed during the patient's care in the ED.    Patient with congestive heart failure clinically and by labs and x-ray.  I was going to try some Lasix on him and see if I could get him to put out enough fluid to watch him briefly and let him go home but then he had an 8 beat run of V. tach.  I think the best thing to do now is just watch him in the hospital at least overnight and make sure he stable.          ____________________________________________   FINAL CLINICAL IMPRESSION(S) / ED DIAGNOSES  Final diagnoses:  Dyspnea, unspecified type  Congestive heart failure, unspecified HF chronicity, unspecified heart failure type [facility])     ED Discharge Orders    None       Note:  This document was prepared using Dragon voice recognition software and may include unintentional dictation errors.    Arnaldo Natal, MD 03/13/19 [phone]

## 2019-03-14 ENCOUNTER — Inpatient Hospital Stay
Admit: 2019-03-14 | Discharge: 2019-03-14 | Disposition: A | Discharge status: Home / Self Care | Payer: Medicare Other | Attending: Internal Medicine | Admitting: Internal Medicine

## 2019-03-14 LAB — GLUCOSE, CAPILLARY
Glucose-Capillary: 126 mg/dL — ABNORMAL HIGH (ref 70–99)
Glucose-Capillary: 154 mg/dL — ABNORMAL HIGH (ref 70–99)
Glucose-Capillary: 123 mg/dL — ABNORMAL HIGH (ref 70–99)
Glucose-Capillary: 122 mg/dL — ABNORMAL HIGH (ref 70–99)

## 2019-03-14 LAB — BASIC METABOLIC PANEL
Anion gap: 10 (ref 5–15)
BUN: 22 mg/dL (ref 8–23)
CO2: 28 mmol/L (ref 22–32)
Calcium: 9 mg/dL (ref 8.9–10.3)
Chloride: 104 mmol/L (ref 98–111)
Creatinine, Ser: 1.39 mg/dL — ABNORMAL HIGH (ref 0.61–1.24)
GFR calc Af Amer: >60 mL/min (ref >60)
GFR calc non Af Amer: 53 mL/min — ABNORMAL LOW (ref >60)
Glucose, Bld: 121 mg/dL — ABNORMAL HIGH (ref 70–99)
Potassium: 3 mmol/L — ABNORMAL LOW (ref 3.5–5.1)
Sodium: 142 mmol/L (ref 135–145)

## 2019-03-14 LAB — ECHOCARDIOGRAM COMPLETE
Height: 61 in
Weight: 5323.2 oz

## 2019-03-14 LAB — TROPONIN I (HIGH SENSITIVITY): Troponin I (High Sensitivity): 16 ng/L (ref <18)

## 2019-03-14 LAB — MAGNESIUM: Magnesium: 1.7 mg/dL (ref 1.7–2.4)

## 2019-03-14 MED ORDER — NITROGLYCERIN 0.4 MG SL SUBL
0.4000 mg | SUBLINGUAL_TABLET | SUBLINGUAL | Status: DC | PRN
  Administered 2019-03-14: 0.4 mg via SUBLINGUAL
  Filled 2019-03-14: qty 1

## 2019-03-14 MED ORDER — AMIODARONE HCL IN DEXTROSE 360-4.14 MG/200ML-% IV SOLN
60.0000 mg/h | INTRAVENOUS | Status: AC
  Administered 2019-03-14 (×2): 60 mg/h via INTRAVENOUS
  Filled 2019-03-14: qty 200

## 2019-03-14 MED ORDER — FUROSEMIDE 10 MG/ML IJ SOLN
40.0000 mg | Freq: Two times a day (BID) | INTRAMUSCULAR | Status: DC
  Administered 2019-03-14 – 2019-03-15 (×2): 40 mg via INTRAVENOUS
  Filled 2019-03-14 (×2): qty 4

## 2019-03-14 MED ORDER — APIXABAN 5 MG PO TABS
5.0000 mg | ORAL_TABLET | Freq: Two times a day (BID) | ORAL | Status: DC
  Administered 2019-03-14 – 2019-03-17 (×7): 5 mg via ORAL
  Filled 2019-03-14 (×7): qty 1

## 2019-03-14 MED ORDER — SACUBITRIL-VALSARTAN 24-26 MG PO TABS
1.0000 | ORAL_TABLET | Freq: Two times a day (BID) | ORAL | Status: DC
  Administered 2019-03-15 – 2019-03-17 (×5): 1 via ORAL
  Filled 2019-03-14 (×5): qty 1

## 2019-03-14 MED ORDER — METOPROLOL TARTRATE 5 MG/5ML IV SOLN
5.0000 mg | Freq: Four times a day (QID) | INTRAVENOUS | Status: DC | PRN
  Administered 2019-03-14: 07:00:00 5 mg via INTRAVENOUS
  Filled 2019-03-14 (×2): qty 5

## 2019-03-14 MED ORDER — AMIODARONE HCL IN DEXTROSE 360-4.14 MG/200ML-% IV SOLN
30.0000 mg/h | INTRAVENOUS | Status: DC
  Administered 2019-03-14 – 2019-03-16 (×5): 30 mg/h via INTRAVENOUS
  Filled 2019-03-14 (×5): qty 200

## 2019-03-14 MED ORDER — POTASSIUM CHLORIDE 20 MEQ PO PACK
40.0000 meq | PACK | Freq: Two times a day (BID) | ORAL | Status: DC
  Administered 2019-03-14 – 2019-03-17 (×5): 40 meq via ORAL
  Filled 2019-03-14 (×5): qty 2

## 2019-03-14 MED ORDER — POTASSIUM CHLORIDE CRYS ER 20 MEQ PO TBCR
20.0000 meq | EXTENDED_RELEASE_TABLET | Freq: Once | ORAL | Status: AC
  Administered 2019-03-14: 20 meq via ORAL
  Filled 2019-03-14: qty 1

## 2019-03-14 NOTE — Progress Notes (Addendum)
Per CCMD patient having short beats of non-sustained vtach. He will have 4 beats than 2, than 3. Patient was complaining of chest pain earlier and I dose of sublingual nitro was given that reduced his pain level. Amiodarone drip was started for afib rvr, Dr. Benjie Karvonen and Dr. Humphrey Rolls made aware of vtach. Orders to monitor patient.   Update 1850: CCMD called nursing staff to inform patient keeps having non-sustained runs of vtach. Patient asymptomatic laying in bed with cpap on. Secure chat sent to cardiologist and Dr.Mody.

## 2019-03-14 NOTE — Progress Notes (Signed)
*  PRELIMINARY RESULTS* Echocardiogram 2D Echocardiogram has been performed.  Sherrie Sport 03/14/2019, 8:42 AM

## 2019-03-14 NOTE — Progress Notes (Signed)
[place] [facility] [place] NAME: John Powell    MR#:  [id number]  DATE OF BIRTH:  04-Apr-1956  SUBJECTIVE:   sob but overall improved  REVIEW OF SYSTEMS:    Review of Systems  Constitutional: Negative for fever, chills weight loss HENT: Negative for ear pain, nosebleeds, congestion, facial swelling, rhinorrhea, neck pain, neck stiffness and ear discharge.   Respiratory: Negative for cough, shortness of breath, wheezing  Cardiovascular: Negative for chest pain, palpitations and leg swelling.  Gastrointestinal: Negative for heartburn, abdominal pain, vomiting, diarrhea or consitpation Genitourinary: Negative for dysuria, urgency, frequency, hematuria Musculoskeletal: Negative for back pain or joint pain Neurological: Negative for dizziness, seizures, syncope, focal weakness,  numbness and headaches.  Hematological: Does not bruise/bleed easily.  Psychiatric/Behavioral: Negative for hallucinations, confusion, dysphoric mood    Tolerating Diet: yes      DRUG ALLERGIES:  No Known Allergies  VITALS:  Blood pressure (!) 134/117, pulse (!) 114, temperature 97.8 F (36.6 C), temperature source Oral, resp. rate 18, height 5\' 1"  (1.549 m), weight (!) 150.9 kg, SpO2 93 %.  PHYSICAL EXAMINATION:  Constitutional: Appears well-developed and well-nourished. No distress. HENT: Normocephalic. Marland Kitchen Oropharynx is clear and moist.  Eyes: Conjunctivae and EOM are normal. PERRLA, no scleral icterus.  Neck: Normal ROM. Neck supple. No JVD. No tracheal deviation. CVS: tachy irr, irr  S1/S2 +, no murmurs, no gallops, no carotid bruit.  Pulmonary: Effort and breath sounds normal, no stridor, rhonchi, wheezes, rales.  Abdominal: Soft. BS +,  no distension, tenderness, rebound or guarding.  Musculoskeletal: Normal range of motion. No edema and no tenderness.  Neuro: Alert. CN 2-12 grossly intact. No focal deficits. Skin: Skin is warm and dry. No rash  noted. Psychiatric: Normal mood and affect.      LABORATORY PANEL:   CBC Recent Labs  Lab 03/13/19 1710  WBC 9.8  HGB 13.1  HCT 39.0  PLT 280   ------------------------------------------------------------------------------------------------------------------  Chemistries  Recent Labs  Lab 03/13/19 1710 03/14/19 0524  NA 140 142  K 3.5 3.0*  CL 105 104  CO2 24 28  GLUCOSE 87 121*  BUN 22 22  CREATININE 1.37* 1.39*  CALCIUM 9.0 9.0  AST 21  --   ALT 29  --   ALKPHOS 72  --   BILITOT 1.5*  --    ------------------------------------------------------------------------------------------------------------------  Cardiac Enzymes No results for input(s): TROPONINI in the last 168 hours. ------------------------------------------------------------------------------------------------------------------  RADIOLOGY:  Dg Chest 2 View  Result Date: 03/13/2019 CLINICAL DATA:  Shortness of breath EXAM: CHEST - 2 VIEW COMPARISON:  10/04/2007 FINDINGS: Cardiomegaly with central vascular congestion. No consolidation or pleural effusion. No pneumothorax. IMPRESSION: Cardiomegaly with vascular congestion. Electronically Signed   By: Donavan Foil M.D.   On: 03/13/2019 17:32     ASSESSMENT AND PLAN:    63 year old male with static heart failure presents emergency room due to shortness of breath.  1.  Acute on chronic combined systolic and diastolic heart failure with ejection fraction of 20 to 25% with possible thrombus noted on echocardiogram: Lasix 40 mg IV twice daily as per cardiology. Monitor intake and output Daily weight CHF clinic upon discharge Continue beta-blocker Add Entresto and discontinue losartan as per recommendations by cardiology. Will need AICD in near future.   2.  Atrial fibrillation with RVR/NSVT: Patient started on amiodarone drip. Continue Coreg. Side effects, alternatives, risks and benefits were discussed with patient about anticoagulation.  He  would like to start Eliquis. Echocardiogram  noted.  3. Hypokalemia: Replete PRN GOAL K>4.0 Check Mg Keep >2.9 4.  Chronic kidney disease stage III: Creatinine stable  5.  Diabetes: Continue sliding scale  Management plans discussed with the patient and he is in agreement.  CODE STATUS: full  TOTAL TIME TAKING CARE OF THIS PATIENT: 35 minutes.     POSSIBLE D/C 2-4 days, DEPENDING ON CLINICAL CONDITION.   Adrian Saran M.D on 03/14/2019 at 10:44 AM  Between 7am to 6pm - Pager - [phone] After 6pm go to [email or web address] - password [email or web address]  [facility] [place] [facility]  Office  [phone]  CC: Primary care physician; [facility]  Note: This dictation was prepared with Dragon dictation along with smaller phrase technology. Any transcriptional errors that result from this process are unintentional.

## 2019-03-14 NOTE — Consult Note (Signed)
PHARMACY CONSULT NOTE - FOLLOW UP  Pharmacy Consult for Electrolyte Monitoring and Replacement   Recent Labs: Potassium (mmol/L)  Date Value  03/14/2019 3.0 (L)  08/31/2012 3.3 (L)   Calcium (mg/dL)  Date Value  03/14/2019 9.0   Calcium, Total (mg/dL)  Date Value  08/31/2012 8.8   Albumin (g/dL)  Date Value  03/13/2019 4.2  08/31/2012 3.6   Sodium (mmol/L)  Date Value  03/14/2019 142  08/31/2012 131 (L)     Assessment: John Powell  is a 63 y.o. male with a known history of congestive heart failure, COPD, coronary artery disease, diabetes, hypertension-has progressive worsening in his symptoms for last few month where he feels increasingly short of breath with minimal exertion and have palpitation feelings on and off. Pt went into afib and started on amiodarone. Pt is on KCl 20 mEq BID.  And Lasix 20 mg IV q12H, losartan 100 mg daily.   Goal of Therapy:  K+ 4, Mg 2.   Plan:  Will increase KCl suppl to 40 mEq BID.   F/u with K+ and Mg with AM labs.   John Powell ,PharmD, BCPS Clinical Pharmacist 03/14/2019 8:36 AM

## 2019-03-14 NOTE — Consult Note (Signed)
  Amiodarone Drug - Drug Interaction Consult Note  Recommendations: QTc 564 but pt in afib (not reliable reading).  Prn zofran d/c'ed. Holding glipizide. There is an increase risk of hypoglycemia with glipizide + amiodarone. Pt is also on coreg, continue to monitor for bradycardia. Pt is on furosemide, monitor potassium and magnesium. Potassium currently 3.0. Pt is on KCl 20 mEq BID. Will recommend to increase KCl suppl to 40 mEq BID.   Amiodarone is metabolized by the cytochrome P450 system and therefore has the potential to cause many drug interactions. Amiodarone has an average plasma half-life of 50 days (range 20 to 100 days).   There is potential for drug interactions to occur several weeks or months after stopping treatment and the onset of drug interactions may be slow after initiating amiodarone.   [x]  Statins: Increased risk of myopathy. Simvastatin- restrict dose to 20mg  daily. Other statins: counsel patients to report any muscle pain or weakness immediately. On atorvastatin.   []  Anticoagulants: Amiodarone can increase anticoagulant effect. Consider warfarin dose reduction. Patients should be monitored closely and the dose of anticoagulant altered accordingly, remembering that amiodarone levels take several weeks to stabilize.  []  Antiepileptics: Amiodarone can increase plasma concentration of phenytoin, the dose should be reduced. Note that small changes in phenytoin dose can result in large changes in levels. Monitor patient and counsel on signs of toxicity.  [x]  Beta blockers: increased risk of bradycardia, AV block and myocardial depression. Sotalol - avoid concomitant use. ON coreg.   []   Calcium channel blockers (diltiazem and verapamil): increased risk of bradycardia, AV block and myocardial depression.  []   Cyclosporine: Amiodarone increases levels of cyclosporine. Reduced dose of cyclosporine is recommended.  []  Digoxin dose should be halved when amiodarone is  started.  [x]  Diuretics: increased risk of cardiotoxicity if hypokalemia occurs. On furosemide.   [x]  Oral hypoglycemic agents (glyburide, glipizide, glimepiride): increased risk of hypoglycemia. Patient's glucose levels should be monitored closely when initiating amiodarone therapy. On glipizide PTA.   []  Drugs that prolong the QT interval:  Torsades de pointes risk may be increased with concurrent use - avoid if possible.  Monitor QTc, also keep magnesium/potassium WNL if concurrent therapy can't be avoided. Marland Kitchen Antibiotics: e.g. fluoroquinolones, erythromycin. . Antiarrhythmics: e.g. quinidine, procainamide, disopyramide, sotalol. . Antipsychotics: e.g. phenothiazines, haloperidol.  . Lithium, tricyclic antidepressants, and methadone. Thank You,  John Powell  03/14/2019 8:25 AM

## 2019-03-14 NOTE — Progress Notes (Signed)
Pt called out w/ c/o 9/10 chest pain. CCMD also called to notify this RN that pt's HR was in the 120s-140s. Emergency standing orders initiated included oxygen, SL nitro, and 12-lead EKG. Notified MD [facility] and order placed for STAT troponin and metoprolol IV 5mg  for HR > 110. Pt's chest pain now 7/10 after 1 nitro and HR now in the 110s-120s. Nursing staff will continue to monitor for any changes in patient status.   Earleen Reaper, RN

## 2019-03-14 NOTE — Consult Note (Signed)
John Powell is a 63 y.o. male  [id number]  Primary Cardiologist: Neoma Laming Reason for Consultation: Chest pain and congestive heart failure  HPI: This is a 63 year old male patient with history of congestive heart failure noncompliant came to the hospital with shortness of breath orthopnea PND and palpitation.  This morning developed some chest pain and shortness of breath.   Review of Systems: Patient does have PND orthopnea   Past Medical History:  Diagnosis Date  . CHF (congestive heart failure) ([place])   . COPD (chronic obstructive pulmonary disease) ([place])   . Coronary artery disease   . Diabetes mellitus without complication ([place])   . Hypertension     Medications Prior to Admission  Medication Sig Dispense Refill  . allopurinol (ZYLOPRIM) 100 MG tablet Take 100 mg by mouth 2 (two) times daily.    Marland Kitchen atorvastatin (LIPITOR) 40 MG tablet Take 20 mg by mouth at bedtime.    . B Complex-C (SUPER B COMPLEX PO) Take 1 tablet by mouth daily.    . carvedilol (COREG) 25 MG tablet Take 25 mg by mouth 2 (two) times daily with a meal.    . glipiZIDE (GLUCOTROL) 5 MG tablet Take 5 mg by mouth 2 (two) times daily.    . hydrALAZINE (APRESOLINE) 100 MG tablet Take 100 mg by mouth 2 (two) times daily.    . hydrochlorothiazide (HYDRODIURIL) 25 MG tablet Take 25 mg by mouth daily.    . isosorbide mononitrate (IMDUR) 60 MG 24 hr tablet Take 30 mg by mouth 2 (two) times daily.    Marland Kitchen losartan (COZAAR) 100 MG tablet Take 100 mg by mouth daily.    . metFORMIN (GLUCOPHAGE) 1000 MG tablet Take 1,000 mg by mouth 2 (two) times daily with a meal.    . potassium chloride (KLOR-CON) 20 MEQ packet Take 20 mEq by mouth 2 (two) times daily.       Marland Kitchen allopurinol  100 mg Oral BID  . atorvastatin  20 mg Oral QHS  . carvedilol  25 mg Oral BID WC  . furosemide  20 mg Intravenous Q12H  . heparin  5,000 Units Subcutaneous Q8H  . hydrALAZINE  100 mg Oral BID  . insulin aspart  0-5 Units Subcutaneous QHS   . insulin aspart  0-9 Units Subcutaneous TID WC  . isosorbide mononitrate  30 mg Oral BID  . losartan  100 mg Oral Daily  . metFORMIN  1,000 mg Oral BID WC  . potassium chloride  20 mEq Oral BID  . sodium chloride flush  3 mL Intravenous Q12H    Infusions: . sodium chloride    . amiodarone     Followed by  . amiodarone      No Known Allergies  Social History   Socioeconomic History  . Marital status: Married    Spouse name: Not on file  . Number of children: Not on file  . Years of education: Not on file  . Highest education level: Not on file  Occupational History  . Not on file  Social Needs  . Financial resource strain: Not on file  . Food insecurity    Worry: Not on file    Inability: Not on file  . Transportation needs    Medical: Not on file    Non-medical: Not on file  Tobacco Use  . Smoking status: Never Smoker  . Smokeless tobacco: Never Used  Substance and Sexual Activity  . Alcohol use: Not on file  .  Drug use: Not on file  . Sexual activity: Not on file  Lifestyle  . Physical activity    Days per week: Not on file    Minutes per session: Not on file  . Stress: Not on file  Relationships  . Social Musicianconnections    Talks on phone: Not on file    Gets together: Not on file    Attends religious service: Not on file    Active member of club or organization: Not on file    Attends meetings of clubs or organizations: Not on file    Relationship status: Not on file  . Intimate partner violence    Fear of current or ex partner: Not on file    Emotionally abused: Not on file    Physically abused: Not on file    Forced sexual activity: Not on file  Other Topics Concern  . Not on file  Social History Narrative  . Not on file    Family History  Problem Relation Age of Onset  . Hypertension Mother     PHYSICAL EXAM: Vitals:   03/14/19 0521 03/14/19 0748  BP: (!) 166/103 (!) 143/105  Pulse: 63 (!) 105  Resp:  20  Temp: 98 F (36.7 C) 98.4 F  (36.9 C)  SpO2: 96% 97%     Intake/Output Summary (Last 24 hours) at 03/14/2019 0804 Last data filed at 03/13/2019 2121 Gross per 24 hour  Intake -  Output 1000 ml  Net -1000 ml    General:  Well appearing. No respiratory difficulty HEENT: normal Neck: supple. no JVD. Carotids 2+ bilat; no bruits. No lymphadenopathy or thryomegaly appreciated. Cor: PMI nondisplaced. Regular rate & rhythm. No rubs, gallops or murmurs. Lungs: clear Abdomen: soft, nontender, nondistended. No hepatosplenomegaly. No bruits or masses. Good bowel sounds. Extremities: no cyanosis, clubbing, rash, edema Neuro: alert & oriented x 3, cranial nerves grossly intact. moves all 4 extremities w/o difficulty. Affect pleasant.  ECG: ATRIAL fibrillation with rapid ventricular response rate and nonspecific ST-T changes with reactive right bundle branch block  Results for orders placed or performed during the hospital encounter of 03/13/19 (from the past 24 hour(s))  Brain natriuretic peptide     Status: Abnormal   Collection Time: 03/13/19  5:07 PM  Result Value Ref Range   B Natriuretic Peptide 476.0 (H) 0.0 - 100.0 pg/mL  Comprehensive metabolic panel     Status: Abnormal   Collection Time: 03/13/19  5:10 PM  Result Value Ref Range   Sodium 140 135 - 145 mmol/L   Potassium 3.5 3.5 - 5.1 mmol/L   Chloride 105 98 - 111 mmol/L   CO2 24 22 - 32 mmol/L   Glucose, Bld 87 70 - 99 mg/dL   BUN 22 8 - 23 mg/dL   Creatinine, Ser 4.091.37 (H) 0.61 - 1.24 mg/dL   Calcium 9.0 8.9 - 81.110.3 mg/dL   Total Protein 7.7 6.5 - 8.1 g/dL   Albumin 4.2 3.5 - 5.0 g/dL   AST 21 15 - 41 U/L   ALT 29 0 - 44 U/L   Alkaline Phosphatase 72 38 - 126 U/L   Total Bilirubin 1.5 (H) 0.3 - 1.2 mg/dL   GFR calc non Af Amer 54 (L) >60 mL/min   GFR calc Af Amer >60 >60 mL/min   Anion gap 11 5 - 15  Troponin I (High Sensitivity)     Status: None   Collection Time: 03/13/19  5:10 PM  Result Value Ref Range  Troponin I (High Sensitivity) 13 <18  ng/L  CBC with Differential     Status: None   Collection Time: 03/13/19  5:10 PM  Result Value Ref Range   WBC 9.8 4.0 - 10.5 K/uL   RBC 4.70 4.22 - 5.81 MIL/uL   Hemoglobin 13.1 13.0 - 17.0 g/dL   HCT 16.1 09.6 - 04.5 %   MCV 83.0 80.0 - 100.0 fL   MCH 27.9 26.0 - 34.0 pg   MCHC 33.6 30.0 - 36.0 g/dL   RDW 40.9 81.1 - 91.4 %   Platelets 280 150 - 400 K/uL   nRBC 0.0 0.0 - 0.2 %   Neutrophils Relative % 69 %   Neutro Abs 6.9 1.7 - 7.7 K/uL   Lymphocytes Relative 20 %   Lymphs Abs 2.0 0.7 - 4.0 K/uL   Monocytes Relative 7 %   Monocytes Absolute 0.7 0.1 - 1.0 K/uL   Eosinophils Relative 2 %   Eosinophils Absolute 0.2 0.0 - 0.5 K/uL   Basophils Relative 1 %   Basophils Absolute 0.1 0.0 - 0.1 K/uL   Immature Granulocytes 1 %   Abs Immature Granulocytes 0.05 0.00 - 0.07 K/uL  Troponin I (High Sensitivity)     Status: None   Collection Time: 03/13/19  7:21 PM  Result Value Ref Range   Troponin I (High Sensitivity) 14 <18 ng/L  SARS Coronavirus 2 [facility] order, Performed in [facility] hospital lab) Nasopharyngeal Nasopharyngeal Swab     Status: None   Collection Time: 03/13/19  7:21 PM   Specimen: Nasopharyngeal Swab  Result Value Ref Range   SARS Coronavirus 2 NEGATIVE NEGATIVE  Glucose, capillary     Status: None   Collection Time: 03/13/19  9:49 PM  Result Value Ref Range   Glucose-Capillary 87 70 - 99 mg/dL  Troponin I (High Sensitivity)     Status: None   Collection Time: 03/13/19 10:03 PM  Result Value Ref Range   Troponin I (High Sensitivity) 16 <18 ng/L  Basic metabolic panel     Status: Abnormal   Collection Time: 03/14/19  5:24 AM  Result Value Ref Range   Sodium 142 135 - 145 mmol/L   Potassium 3.0 (L) 3.5 - 5.1 mmol/L   Chloride 104 98 - 111 mmol/L   CO2 28 22 - 32 mmol/L   Glucose, Bld 121 (H) 70 - 99 mg/dL   BUN 22 8 - 23 mg/dL   Creatinine, Ser 7.82 (H) 0.61 - 1.24 mg/dL   Calcium 9.0 8.9 - 95.6 mg/dL   GFR calc non Af Amer 53 (L) >60 mL/min   GFR  calc Af Amer >60 >60 mL/min   Anion gap 10 5 - 15  Glucose, capillary     Status: Abnormal   Collection Time: 03/14/19  7:49 AM  Result Value Ref Range   Glucose-Capillary 126 (H) 70 - 99 mg/dL   Dg Chest 2 View  Result Date: 03/13/2019 CLINICAL DATA:  Shortness of breath EXAM: CHEST - 2 VIEW COMPARISON:  10/04/2007 FINDINGS: Cardiomegaly with central vascular congestion. No consolidation or pleural effusion. No pneumothorax. IMPRESSION: Cardiomegaly with vascular congestion. Electronically Signed   By: Jasmine Pang M.D.   On: 03/13/2019 17:32     ASSESSMENT AND PLAN: Congestive heart failure in the setting of atrial fibrillation with rapid ventricular response rate.  Initial troponin and EKG were unremarkable but this morning's EKG shows atrial fibrillation with rapid ventricular response rate.  Started the patient on IV amiodarone drip  and will diurese the patient with IV Lasix.  Advise switching to p.o. amiodarone 800 mg daily once converts back to sinus rhythm.  He will have to take amiodarone p.o. amiodarone in a tapering dose once his converts into sinus rhythm.  Advise heparin.      Ranier Coach A

## 2019-03-15 LAB — BASIC METABOLIC PANEL
Anion gap: 15 (ref 5–15)
BUN: 25 mg/dL — ABNORMAL HIGH (ref 8–23)
CO2: 23 mmol/L (ref 22–32)
Calcium: 9 mg/dL (ref 8.9–10.3)
Chloride: 104 mmol/L (ref 98–111)
Creatinine, Ser: 1.5 mg/dL — ABNORMAL HIGH (ref 0.61–1.24)
GFR calc Af Amer: 56 mL/min — ABNORMAL LOW (ref >60)
GFR calc non Af Amer: 49 mL/min — ABNORMAL LOW (ref >60)
Glucose, Bld: 142 mg/dL — ABNORMAL HIGH (ref 70–99)
Potassium: 2.9 mmol/L — ABNORMAL LOW (ref 3.5–5.1)
Sodium: 142 mmol/L (ref 135–145)

## 2019-03-15 LAB — GLUCOSE, CAPILLARY
Glucose-Capillary: 142 mg/dL — ABNORMAL HIGH (ref 70–99)
Glucose-Capillary: 166 mg/dL — ABNORMAL HIGH (ref 70–99)
Glucose-Capillary: 158 mg/dL — ABNORMAL HIGH (ref 70–99)
Glucose-Capillary: 177 mg/dL — ABNORMAL HIGH (ref 70–99)

## 2019-03-15 LAB — HEMOGLOBIN A1C
Hgb A1c MFr Bld: 5.8 % — ABNORMAL HIGH (ref 4.8–5.6)
Mean Plasma Glucose: 120 mg/dL

## 2019-03-15 LAB — POTASSIUM: Potassium: 3.2 mmol/L — ABNORMAL LOW (ref 3.5–5.1)

## 2019-03-15 LAB — HIV ANTIBODY (ROUTINE TESTING W REFLEX): HIV Screen 4th Generation wRfx: NONREACTIVE

## 2019-03-15 LAB — MAGNESIUM: Magnesium: 1.8 mg/dL (ref 1.7–2.4)

## 2019-03-15 MED ORDER — POTASSIUM CHLORIDE 10 MEQ/100ML IV SOLN
10.0000 meq | INTRAVENOUS | Status: DC
  Administered 2019-03-15 (×3): 10 meq via INTRAVENOUS
  Filled 2019-03-15 (×4): qty 100

## 2019-03-15 MED ORDER — FUROSEMIDE 10 MG/ML IJ SOLN
40.0000 mg | Freq: Every day | INTRAMUSCULAR | Status: DC
  Administered 2019-03-16 – 2019-03-17 (×2): 40 mg via INTRAVENOUS
  Filled 2019-03-15 (×2): qty 4

## 2019-03-15 MED ORDER — POTASSIUM CHLORIDE 10 MEQ/100ML IV SOLN
10.0000 meq | INTRAVENOUS | Status: AC
  Filled 2019-03-15 (×2): qty 100

## 2019-03-15 MED ORDER — HYDRALAZINE HCL 50 MG PO TABS
50.0000 mg | ORAL_TABLET | Freq: Two times a day (BID) | ORAL | Status: DC
  Administered 2019-03-15 – 2019-03-17 (×4): 50 mg via ORAL
  Filled 2019-03-15 (×4): qty 1

## 2019-03-15 MED ORDER — MAGNESIUM SULFATE 2 GM/50ML IV SOLN
2.0000 g | Freq: Once | INTRAVENOUS | Status: AC
  Administered 2019-03-15: 2 g via INTRAVENOUS
  Filled 2019-03-15: qty 50

## 2019-03-15 MED ORDER — SODIUM CHLORIDE 0.9% FLUSH
10.0000 mL | Freq: Two times a day (BID) | INTRAVENOUS | Status: DC
  Administered 2019-03-15 – 2019-03-17 (×3): 10 mL

## 2019-03-15 MED ORDER — SODIUM CHLORIDE 0.9% FLUSH
10.0000 mL | INTRAVENOUS | Status: DC | PRN
  Administered 2019-03-16: 10 mL
  Filled 2019-03-15: qty 40

## 2019-03-15 MED ORDER — POTASSIUM CHLORIDE CRYS ER 20 MEQ PO TBCR
40.0000 meq | EXTENDED_RELEASE_TABLET | Freq: Once | ORAL | Status: AC
  Administered 2019-03-15: 40 meq via ORAL
  Filled 2019-03-15: qty 2

## 2019-03-15 NOTE — Consult Note (Signed)
PHARMACY CONSULT NOTE - FOLLOW UP  Pharmacy Consult for Electrolyte Monitoring and Replacement   Recent Labs: Potassium (mmol/L)  Date Value  03/15/2019 3.2 (L)  08/31/2012 3.3 (L)   Magnesium (mg/dL)  Date Value  03/15/2019 1.8   Calcium (mg/dL)  Date Value  03/15/2019 9.0   Calcium, Total (mg/dL)  Date Value  08/31/2012 8.8   Albumin (g/dL)  Date Value  03/13/2019 4.2  08/31/2012 3.6   Sodium (mmol/L)  Date Value  03/15/2019 142  08/31/2012 131 (L)   John Powell  is a 63 y.o. male with a known history of congestive heart failure, COPD, coronary artery disease, diabetes, hypertension.  He has had progressive worsening in his symptoms for the last few months where he felt increasingly short of breath with minimal exertion with palpitations on and off. Pt went into afib and started on amiodarone. Pt is on KCl 40 mEq BID inpatient.  Lasix has been weaned to 40 mg IV daily, and patient has been started on Entresto 24-26 mg BID.  Assessment: K+ 3.2(@1800 ).  Currently subtherapeutic.    Mg 1.8.  Currently subtherapeutic.  Goal of Therapy:  K+ 4, Mg 2.   Plan:  Patient only received 2 doses of KCl 63mEq IV out of the 4 orderd. Will reorder the remaining 2 doses and will continue KCl 40 mEq PO BID.Will bolus with Magnesium sulfate 2 g x 1 dose.  Will recheck electrolytes with AM labs.   Pearla Dubonnet, PharmD Clinical Pharmacist 03/15/2019 6:27 PM

## 2019-03-15 NOTE — Consult Note (Signed)
PHARMACY CONSULT NOTE - FOLLOW UP  Pharmacy Consult for Electrolyte Monitoring and Replacement   Recent Labs: Potassium (mmol/L)  Date Value  03/15/2019 2.9 (L)  08/31/2012 3.3 (L)   Magnesium (mg/dL)  Date Value  03/15/2019 1.8   Calcium (mg/dL)  Date Value  03/15/2019 9.0   Calcium, Total (mg/dL)  Date Value  08/31/2012 8.8   Albumin (g/dL)  Date Value  03/13/2019 4.2  08/31/2012 3.6   Sodium (mmol/L)  Date Value  03/15/2019 142  08/31/2012 131 (L)   John Powell  is a 63 y.o. male with a known history of congestive heart failure, COPD, coronary artery disease, diabetes, hypertension.  He has had progressive worsening in his symptoms for the last few months where he felt increasingly short of breath with minimal exertion with palpitations on and off. Pt went into afib and started on amiodarone. Pt is on KCl 40 mEq BID inpatient.  Lasix has been weaned to 40 mg IV daily, and patient has been started on Entresto 24-26 mg BID.  Assessment: K+ 2.9.  Currently subtherapeutic.    Mg 1.8.  Currently subtherapeutic.  Goal of Therapy:  K+ 4, Mg 2.   Plan:  Will continue KCl 40 mEq BID, and order KCl 10 mEq IV Q1 hour x 4 doses. Will bolus with Magnesium sulfate 2 g x 1 dose.  F/u with K+ level this evening, and Mg with AM labs.   Gerald Dexter, PharmD Pharmacy Resident  03/15/2019 1:57 PM

## 2019-03-15 NOTE — Plan of Care (Signed)
  Problem: Clinical Measurements: Goal: Cardiovascular complication will be avoided Outcome: Not Progressing  Asymptomatic with reports of VT vs Afib with aberrancy on telemetry.

## 2019-03-15 NOTE — Progress Notes (Addendum)
SUBJECTIVE: Pt is resting comfortably, sleeping on CPAP.   Vitals:   03/14/19 2103 03/15/19 0057 03/15/19 0424 03/15/19 0656  BP: 129/72 (!) 130/93 (!) 138/111 105/73  Pulse: 89 (!) 104 (!) 112 (!) 108  Resp: 16 17 19    Temp:  98.5 F (36.9 C) (!) 97.5 F (36.4 C)   TempSrc:  Oral Oral   SpO2: 93% 96% 97% 99%  Weight:   (!) 148 kg   Height:        Intake/Output Summary (Last 24 hours) at 03/15/2019 0705 Last data filed at 03/15/2019 0426 Gross per 24 hour  Intake 573.8 ml  Output 2300 ml  Net -1726.2 ml    LABS: Basic Metabolic Panel: Recent Labs    03/14/19 0524 03/14/19 0717 03/15/19 0518  NA 142  --  142  K 3.0*  --  2.9*  CL 104  --  104  CO2 28  --  23  GLUCOSE 121*  --  142*  BUN 22  --  25*  CREATININE 1.39*  --  1.50*  CALCIUM 9.0  --  9.0  MG  --  1.7 1.8   Liver Function Tests: Recent Labs    03/13/19 1710  AST 21  ALT 29  ALKPHOS 72  BILITOT 1.5*  PROT 7.7  ALBUMIN 4.2   No results for input(s): LIPASE, AMYLASE in the last 72 hours. CBC: Recent Labs    03/13/19 1710  WBC 9.8  NEUTROABS 6.9  HGB 13.1  HCT 39.0  MCV 83.0  PLT 280   Cardiac Enzymes: No results for input(s): CKTOTAL, CKMB, CKMBINDEX, TROPONINI in the last 72 hours. BNP: Invalid input(s): POCBNP D-Dimer: No results for input(s): DDIMER in the last 72 hours. Hemoglobin A1C: Recent Labs    03/13/19 2203  HGBA1C 5.8*   Fasting Lipid Panel: No results for input(s): CHOL, HDL, LDLCALC, TRIG, CHOLHDL, LDLDIRECT in the last 72 hours. Thyroid Function Tests: No results for input(s): TSH, T4TOTAL, T3FREE, THYROIDAB in the last 72 hours.  Invalid input(s): FREET3 Anemia Panel: No results for input(s): VITAMINB12, FOLATE, FERRITIN, TIBC, IRON, RETICCTPCT in the last 72 hours.   PHYSICAL EXAM General: Well developed, well nourished, in no acute distress HEENT:  Normocephalic and atramatic Neck:  No JVD.  Lungs: Clear bilaterally to auscultation and percussion. Heart:  HRRR . Normal S1 and S2 without gallops or murmurs.  Abdomen: Bowel sounds are positive, abdomen soft and non-tender  Msk:  Back normal, normal gait. Normal strength and tone for age. Extremities: No clubbing, cyanosis or edema.     TELEMETRY: Afib vent rat 108bpm  ASSESSMENT AND PLAN:  CHF with reduced ejection fraction in setting of Atrial fibrillation with RVR:  Remains in Afib with rate 110bpm. Continue amiodarone drip and eliquis.   Echo results:   LVEF  is 20 to 25%. There is mildly increased left ventricular hypertrophy.  2. Small, fixed thrombus on the apical wall of the left ventricle.  3. Global right ventricle has moderately reduced systolic function.The right ventricular size is moderately enlarged. No increase in right ventricular wall thickness.  Start carvedilol and Entresto, continue diuresis with lasix. Cut back hydralazine to 50mg  BID and monitor BP closely after starting Entresto. May need to DC hydralazine. Will continue to follow.  Principal Problem:   Acute CHF (congestive heart failure) ([facility]) Active Problems:   Acute on chronic diastolic CHF (congestive heart failure) ([place])    Jake Bathe, NP-C 03/15/2019 7:05 AM Cell: [phone]

## 2019-03-15 NOTE — Progress Notes (Signed)
[facility] [place] NAME: John Powell    MR#:  [id number]  DATE OF BIRTH:  May 21, 1956  SUBJECTIVE:  John Powell of breath has improved.  Patient still in atrial fibrillation.  REVIEW OF SYSTEMS:    Review of Systems  Constitutional: Negative for fever, chills weight loss HENT: Negative for ear pain, nosebleeds, congestion, facial swelling, rhinorrhea, neck pain, neck stiffness and ear discharge.   Respiratory: Negative for cough, resolved shortness of breath, wheezing  Cardiovascular: Negative for chest pain, palpitations and leg swelling.  Gastrointestinal: Negative for heartburn, abdominal pain, vomiting, diarrhea or consitpation Genitourinary: Negative for dysuria, urgency, frequency, hematuria Musculoskeletal: Negative for back pain or joint pain Neurological: Negative for dizziness, seizures, syncope, focal weakness,  numbness and headaches.  Hematological: Does not bruise/bleed easily.  Psychiatric/Behavioral: Negative for hallucinations, confusion, dysphoric mood    Tolerating Diet: yes      DRUG ALLERGIES:  No Known Allergies  VITALS:  Blood pressure (!) 107/91, pulse (!) 101, temperature 97.6 F (36.4 C), temperature source Oral, resp. rate 20, height 5\' 1"  (1.549 m), weight (!) 148 kg, SpO2 99 %.  PHYSICAL EXAMINATION:  Constitutional: Appears well-developed and well-nourished. No distress. HENT: Normocephalic. Marland Kitchen Oropharynx is clear and moist.  Eyes: Conjunctivae and EOM are normal. PERRLA, no scleral icterus.  Neck: Normal ROM. Neck supple. No JVD. No tracheal deviation. CVS: tachy irr, irr  S1/S2 +, no murmurs, no gallops, no carotid bruit.  Pulmonary: Effort and breath sounds normal, no stridor, rhonchi, wheezes, rales.  Abdominal: Soft. BS +,  no distension, tenderness, rebound or guarding.  Musculoskeletal: Normal range of motion. No edema and no tenderness.  Neuro: Alert. CN 2-12 grossly intact. No focal  deficits. Skin: Skin is warm and dry. No rash noted. Psychiatric: Normal mood and affect.      LABORATORY PANEL:   CBC Recent Labs  Lab 03/13/19 1710  WBC 9.8  HGB 13.1  HCT 39.0  PLT 280   ------------------------------------------------------------------------------------------------------------------  Chemistries  Recent Labs  Lab 03/13/19 1710  03/15/19 0518  NA 140   < > 142  K 3.5   < > 2.9*  CL 105   < > 104  CO2 24   < > 23  GLUCOSE 87   < > 142*  BUN 22   < > 25*  CREATININE 1.37*   < > 1.50*  CALCIUM 9.0   < > 9.0  MG  --    < > 1.8  AST 21  --   --   ALT 29  --   --   ALKPHOS 72  --   --   BILITOT 1.5*  --   --    < > = values in this interval not displayed.   ------------------------------------------------------------------------------------------------------------------  Cardiac Enzymes No results for input(s): TROPONINI in the last 168 hours. ------------------------------------------------------------------------------------------------------------------  RADIOLOGY:  Dg Chest 2 View  Result Date: 03/13/2019 CLINICAL DATA:  Shortness of breath EXAM: CHEST - 2 VIEW COMPARISON:  10/04/2007 FINDINGS: Cardiomegaly with central vascular congestion. No consolidation or pleural effusion. No pneumothorax. IMPRESSION: Cardiomegaly with vascular congestion. Electronically Signed   By: Donavan Foil M.D.   On: 03/13/2019 17:32     ASSESSMENT AND PLAN:    63 year old male with static heart failure presents emergency room due to shortness of breath.  1.  Acute on chronic combined systolic and diastolic heart failure with ejection fraction of 20 to 25% with possible thrombus noted on echocardiogram:  Decreased dose of Lasix since creatinine has increased.  Patient is 2800 L negative.    Continue to monitor intake and output Daily weight CHF clinic upon discharge Continue beta-blocker/Entresto May need AICD in near future.   2.  Atrial fibrillation  with RVR/NSVT: Continue amiodarone drip. Continue Coreg. Continue Eliquis for stroke prevention  3. Hypokalemia: Replete  GOAL K>4.0  Mg -> Keep >2   4.  Chronic kidney disease stage III: Creatinine relatively stable Recheck in am  5.  Diabetes, A1c 5.8: Continue sliding scale    Management plans discussed with the patient and he is in agreement.  CODE STATUS: full  TOTAL TIME TAKING CARE OF THIS PATIENT: 27 minutes.     POSSIBLE D/C 2-3 days, DEPENDING ON CLINICAL CONDITION.   Adrian Saran M.D on 03/15/2019 at 12:41 PM  Between 7am to 6pm - Pager - [phone] After 6pm go to [email or web address] - password [email or web address]  [facility]  Office  [phone]  CC: Primary care physician; [facility]  Note: This dictation was prepared with Dragon dictation along with smaller phrase technology. Any transcriptional errors that result from this process are unintentional.

## 2019-03-15 NOTE — Consult Note (Signed)
  Amiodarone Drug - Drug Interaction Consult Note  Recommendations: QTc 564 but pt in afib (not reliable reading).  Prn zofran d/c'ed. Holding glipizide. There is an increase risk of hypoglycemia with glipizide + amiodarone. Pt is also on coreg, continue to monitor for bradycardia. Pt is on furosemide, monitor potassium and magnesium.  Patient has started entresto, monitor potassium. Potassium currently 2.9. Pt is on KCl 40 mEq BID and receiving KCl 10 mEq Q1 hour x 4 doses, as well as magnesium sulfate 2 g IV x 1 dose.  Amiodarone is metabolized by the cytochrome P450 system and therefore has the potential to cause many drug interactions. Amiodarone has an average plasma half-life of 50 days (range 20 to 100 days).   There is potential for drug interactions to occur several weeks or months after stopping treatment and the onset of drug interactions may be slow after initiating amiodarone.   [x]  Statins: Increased risk of myopathy. Simvastatin- restrict dose to 20mg  daily. Other statins: counsel patients to report any muscle pain or weakness immediately. On atorvastatin.   []  Anticoagulants: Amiodarone can increase anticoagulant effect. Consider warfarin dose reduction. Patients should be monitored closely and the dose of anticoagulant altered accordingly, remembering that amiodarone levels take several weeks to stabilize.  []  Antiepileptics: Amiodarone can increase plasma concentration of phenytoin, the dose should be reduced. Note that small changes in phenytoin dose can result in large changes in levels. Monitor patient and counsel on signs of toxicity.  [x]  Beta blockers: increased risk of bradycardia, AV block and myocardial depression. Sotalol - avoid concomitant use. ON coreg.   []   Calcium channel blockers (diltiazem and verapamil): increased risk of bradycardia, AV block and myocardial depression.  []   Cyclosporine: Amiodarone increases levels of cyclosporine. Reduced dose of  cyclosporine is recommended.  []  Digoxin dose should be halved when amiodarone is started.  [x]  Diuretics: increased risk of cardiotoxicity if hypokalemia occurs. On furosemide.   [x]  Oral hypoglycemic agents (glyburide, glipizide, glimepiride): increased risk of hypoglycemia. Patient's glucose levels should be monitored closely when initiating amiodarone therapy. On glipizide PTA.   []  Drugs that prolong the QT interval:  Torsades de pointes risk may be increased with concurrent use - avoid if possible.  Monitor QTc, also keep magnesium/potassium WNL if concurrent therapy can't be avoided. Marland Kitchen Antibiotics: e.g. fluoroquinolones, erythromycin. . Antiarrhythmics: e.g. quinidine, procainamide, disopyramide, sotalol. . Antipsychotics: e.g. phenothiazines, haloperidol.  . Lithium, tricyclic antidepressants, and methadone. Thank You,  Gerald Dexter  03/15/2019 2:01 PM

## 2019-03-16 LAB — BASIC METABOLIC PANEL
Anion gap: 12 (ref 5–15)
BUN: 26 mg/dL — ABNORMAL HIGH (ref 8–23)
CO2: 28 mmol/L (ref 22–32)
Calcium: 9 mg/dL (ref 8.9–10.3)
Chloride: 103 mmol/L (ref 98–111)
Creatinine, Ser: 1.52 mg/dL — ABNORMAL HIGH (ref 0.61–1.24)
GFR calc Af Amer: 55 mL/min — ABNORMAL LOW (ref >60)
GFR calc non Af Amer: 48 mL/min — ABNORMAL LOW (ref >60)
Glucose, Bld: 156 mg/dL — ABNORMAL HIGH (ref 70–99)
Potassium: 3.5 mmol/L (ref 3.5–5.1)
Sodium: 143 mmol/L (ref 135–145)

## 2019-03-16 LAB — GLUCOSE, CAPILLARY
Glucose-Capillary: 159 mg/dL — ABNORMAL HIGH (ref 70–99)
Glucose-Capillary: 175 mg/dL — ABNORMAL HIGH (ref 70–99)
Glucose-Capillary: 129 mg/dL — ABNORMAL HIGH (ref 70–99)
Glucose-Capillary: 202 mg/dL — ABNORMAL HIGH (ref 70–99)

## 2019-03-16 LAB — MAGNESIUM: Magnesium: 2.2 mg/dL (ref 1.7–2.4)

## 2019-03-16 MED ORDER — IPRATROPIUM-ALBUTEROL 0.5-2.5 (3) MG/3ML IN SOLN: 3.0000 mL | RESPIRATORY_TRACT | Status: DC | PRN

## 2019-03-16 MED ORDER — TRAZODONE HCL 50 MG PO TABS
50.0000 mg | ORAL_TABLET | Freq: Every evening | ORAL | Status: DC | PRN
  Administered 2019-03-16 (×2): 50 mg via ORAL
  Filled 2019-03-16 (×2): qty 1

## 2019-03-16 MED ORDER — AMIODARONE HCL 200 MG PO TABS
400.0000 mg | ORAL_TABLET | Freq: Two times a day (BID) | ORAL | Status: DC
  Administered 2019-03-16 – 2019-03-17 (×2): 400 mg via ORAL
  Filled 2019-03-16 (×2): qty 2

## 2019-03-16 MED ORDER — POTASSIUM CHLORIDE CRYS ER 20 MEQ PO TBCR
20.0000 meq | EXTENDED_RELEASE_TABLET | Freq: Once | ORAL | Status: AC
  Administered 2019-03-16: 20 meq via ORAL
  Filled 2019-03-16: qty 1

## 2019-03-16 MED ORDER — SPIRONOLACTONE 25 MG PO TABS
12.5000 mg | ORAL_TABLET | Freq: Every day | ORAL | Status: DC
  Administered 2019-03-16 – 2019-03-17 (×2): 12.5 mg via ORAL
  Filled 2019-03-16: qty 1
  Filled 2019-03-16: qty 0.5
  Filled 2019-03-16: qty 1
  Filled 2019-03-16: qty 0.5

## 2019-03-16 NOTE — Progress Notes (Signed)
SUBJECTIVE: Pt remains mildly short of breath with palpitations and fluttering reported.   Vitals:   03/15/19 2100 03/15/19 2300 03/16/19 0414 03/16/19 0855  BP:  (!) 143/105 (!) 150/112 (!) 124/106  Pulse: (!) 122 (!) 106 (!) 115 (!) 135  Resp: 20 20 16    Temp:  97.7 F (36.5 C) (!) 97.5 F (36.4 C) (!) 97.5 F (36.4 C)  TempSrc:  Oral Oral Oral  SpO2: 96% 94% 98% 94%  Weight:   (!) 148 kg   Height:        Intake/Output Summary (Last 24 hours) at 03/16/2019 0921 Last data filed at 03/16/2019 0700 Gross per 24 hour  Intake 652.5 ml  Output 1555 ml  Net -902.5 ml    LABS: Basic Metabolic Panel: Recent Labs    03/15/19 0518 03/15/19 1746 03/16/19 0604  NA 142  --  143  K 2.9* 3.2* 3.5  CL 104  --  103  CO2 23  --  28  GLUCOSE 142*  --  156*  BUN 25*  --  26*  CREATININE 1.50*  --  1.52*  CALCIUM 9.0  --  9.0  MG 1.8  --  2.2   Liver Function Tests: Recent Labs    03/13/19 1710  AST 21  ALT 29  ALKPHOS 72  BILITOT 1.5*  PROT 7.7  ALBUMIN 4.2   No results for input(s): LIPASE, AMYLASE in the last 72 hours. CBC: Recent Labs    03/13/19 1710  WBC 9.8  NEUTROABS 6.9  HGB 13.1  HCT 39.0  MCV 83.0  PLT 280   Cardiac Enzymes: No results for input(s): CKTOTAL, CKMB, CKMBINDEX, TROPONINI in the last 72 hours. BNP: Invalid input(s): POCBNP D-Dimer: No results for input(s): DDIMER in the last 72 hours. Hemoglobin A1C: Recent Labs    03/13/19 2203  HGBA1C 5.8*   Fasting Lipid Panel: No results for input(s): CHOL, HDL, LDLCALC, TRIG, CHOLHDL, LDLDIRECT in the last 72 hours. Thyroid Function Tests: No results for input(s): TSH, T4TOTAL, T3FREE, THYROIDAB in the last 72 hours.  Invalid input(s): FREET3 Anemia Panel: No results for input(s): VITAMINB12, FOLATE, FERRITIN, TIBC, IRON, RETICCTPCT in the last 72 hours.   PHYSICAL EXAM General: Well developed, well nourished, in no acute distress HEENT:  Normocephalic and atramatic Neck:  No JVD.   Lungs: Clear bilaterally to auscultation and percussion. Heart: HRRR . Normal S1 and S2 without gallops or murmurs.  Abdomen: Bowel sounds are positive, abdomen soft and non-tender  Msk:  Back normal, normal gait. Normal strength and tone for age. Extremities: No clubbing, cyanosis or edema.     TELEMETRY: Afib vent rat 108bpm  ASSESSMENT AND PLAN:  CHF with reduced ejection fraction in setting of Atrial fibrillation with RVR:  Remains in Afib with rate 110bpm. Continue amiodarone drip and eliquis.   Echo results:   LVEF  is 20 to 25%. There is mildly increased left ventricular hypertrophy.  2. Small, fixed thrombus on the apical wall of the left ventricle.  3. Global right ventricle has moderately reduced systolic function.The right ventricular size is moderately enlarged. No increase in right ventricular wall thickness.  Continue coreg, entresto, and add aldactone. Monitor potassium.   Principal Problem:   Acute CHF (congestive heart failure) (HCC) Active Problems:   Acute on chronic diastolic CHF (congestive heart failure) ([place])    Jake Bathe, NP-C 03/16/2019 9:21 AM Cell: [phone]

## 2019-03-16 NOTE — Progress Notes (Signed)
Notified Dr.Gouru about patient's cardiac rhythm and rate. Dr.Gouru discontinued the drip and ordered oral amiodarone. Dr.Gouru requested that the events leading to our conversation.

## 2019-03-16 NOTE — Progress Notes (Signed)
RN is reporting the patient is converted to normal sinus rhythm at 4:03 PM and consistently in sinus rhythm with heart rate 50s.  Will discontinue amiodarone drip and started patient on p.o. amiodarone 400 twice daily

## 2019-03-16 NOTE — Consult Note (Signed)
PHARMACY CONSULT NOTE - FOLLOW UP  Pharmacy Consult for Electrolyte Monitoring and Replacement   Recent Labs: Potassium (mmol/L)  Date Value  03/16/2019 3.5  08/31/2012 3.3 (L)   Magnesium (mg/dL)  Date Value  03/16/2019 2.2   Calcium (mg/dL)  Date Value  03/16/2019 9.0   Calcium, Total (mg/dL)  Date Value  08/31/2012 8.8   Albumin (g/dL)  Date Value  03/13/2019 4.2  08/31/2012 3.6   Sodium (mmol/L)  Date Value  03/16/2019 143  08/31/2012 131 (L)   John Powell  is a 63 y.o. male with a known history of congestive heart failure, COPD, coronary artery disease, diabetes, hypertension.  He has had progressive worsening in his symptoms for the last few months where he felt increasingly short of breath with minimal exertion with palpitations on and off. Pt went into afib and started on amiodarone. Pt is on KCl 40 mEq BID inpatient.  Lasix has been weaned to 40 mg IV daily, and patient has been started on Entresto 24-26 mg BID on 9/29, and started on spironolactone 12.5 mg daily 9/30.   Assessment: K+ 3.5.  Currently subtherapeutic.    Mg 2.2.  Currently subtherapeutic.  Goal of Therapy:  K+ 4, Mg 2.   Plan:  Will continue KCl 40 mEq BID, and order KCl 20 mEq PO x 1  F/u with K+ level with AM labs.   [name], PharmD, BCPS Pharmacy Resident  03/16/2019 11:25 AM

## 2019-03-16 NOTE — Plan of Care (Signed)
Nutrition Education Note  RD consulted for nutrition education regarding CHF.  Met with patient at bedside. He reports a good appetite and intake here and at baseline. He reports he follows a carbohydrate-modified diet at home. He also limits sodium intake. He does not add salt during cooking or at the table and limits intake of processed foods. He drinks about 32 fl oz daily but has not been told a fluid restriction before. He has a digital scale at home and weighs himself "when he showers." Encouraged patient to weigh himself daily.  RD provided "Low Sodium Nutrition Therapy" handout from the Academy of [employer]. Reviewed patient's dietary recall. Provided examples on ways to decrease sodium intake in diet. Discouraged intake of processed foods and use of salt shaker. Encouraged fresh fruits and vegetables as well as whole grain sources of carbohydrates to maximize fiber intake.   RD discussed why it is important for patient to adhere to diet recommendations, and emphasized the role of fluids, foods to avoid, and importance of weighing self daily. Teach back method used.  Expect good compliance.  Body mass index is 61.65 kg/m. Pt meets criteria for obesity class III based on current BMI.  Current diet order is heart healthy/carbohydrate modified, patient is consuming approximately 100% of meals at this time. Labs and medications reviewed. No further nutrition interventions warranted at this time. RD contact information provided. If additional nutrition issues arise, please re-consult RD.   Willey Blade, MS, [place], LDN Office: [phone] Pager: [phone] After Hours/Weekend Pager: [phone]

## 2019-03-16 NOTE — Progress Notes (Signed)
This RN noticed the pt had converted to NSR at 1730. I then called [facility] to ask when pt converted and the [facility] tech, Laurian Brim, was not aware that that had happened and had to go back and look at the strips to find that the pt did in fact convert to NSR at 1603. He documented that he notified me at 1603 but that was false documentation because I am the one that noticed it and I am the one that called him and made him aware that pt was in NSR with a heart rate of 57-65. I messaged Dr. Anselm Jungling at [phone] about the pt converting to NSR but he never responded.

## 2019-03-17 LAB — CBC
HCT: 39.5 % (ref 39.0–52.0)
Hemoglobin: 13 g/dL (ref 13.0–17.0)
MCH: 27.4 pg (ref 26.0–34.0)
MCHC: 32.9 g/dL (ref 30.0–36.0)
MCV: 83.2 fL (ref 80.0–100.0)
Platelets: 255 10*3/uL (ref 150–400)
RBC: 4.75 MIL/uL (ref 4.22–5.81)
RDW: 14 % (ref 11.5–15.5)
WBC: 9.1 10*3/uL (ref 4.0–10.5)
nRBC: 0 % (ref 0.0–0.2)

## 2019-03-17 LAB — BASIC METABOLIC PANEL
Anion gap: 9 (ref 5–15)
BUN: 28 mg/dL — ABNORMAL HIGH (ref 8–23)
CO2: 25 mmol/L (ref 22–32)
Calcium: 8.5 mg/dL — ABNORMAL LOW (ref 8.9–10.3)
Chloride: 106 mmol/L (ref 98–111)
Creatinine, Ser: 1.6 mg/dL — ABNORMAL HIGH (ref 0.61–1.24)
GFR calc Af Amer: 52 mL/min — ABNORMAL LOW (ref >60)
GFR calc non Af Amer: 45 mL/min — ABNORMAL LOW (ref >60)
Glucose, Bld: 143 mg/dL — ABNORMAL HIGH (ref 70–99)
Potassium: 3.3 mmol/L — ABNORMAL LOW (ref 3.5–5.1)
Sodium: 140 mmol/L (ref 135–145)

## 2019-03-17 LAB — GLUCOSE, CAPILLARY
Glucose-Capillary: 124 mg/dL — ABNORMAL HIGH (ref 70–99)
Glucose-Capillary: 172 mg/dL — ABNORMAL HIGH (ref 70–99)

## 2019-03-17 MED ORDER — HYDRALAZINE HCL 50 MG PO TABS: 50.0000 mg | ORAL_TABLET | Freq: Two times a day (BID) | ORAL | 0 refills | Status: AC

## 2019-03-17 MED ORDER — AMIODARONE HCL 200 MG PO TABS: 400.0000 mg | ORAL_TABLET | Freq: Every day | ORAL | Status: DC

## 2019-03-17 MED ORDER — SACUBITRIL-VALSARTAN 24-26 MG PO TABS: 1.0000 | ORAL_TABLET | Freq: Two times a day (BID) | ORAL | 0 refills | Status: DC

## 2019-03-17 MED ORDER — APIXABAN 5 MG PO TABS: 5.0000 mg | ORAL_TABLET | Freq: Two times a day (BID) | ORAL | 0 refills | Status: DC

## 2019-03-17 MED ORDER — FUROSEMIDE 20 MG PO TABS: 20.0000 mg | ORAL_TABLET | Freq: Every day | ORAL | Status: DC

## 2019-03-17 MED ORDER — CARVEDILOL 12.5 MG PO TABS: 12.5000 mg | ORAL_TABLET | Freq: Two times a day (BID) | ORAL | 0 refills | Status: DC

## 2019-03-17 MED ORDER — FUROSEMIDE 20 MG PO TABS: 20.0000 mg | ORAL_TABLET | Freq: Every day | ORAL | 0 refills | Status: AC

## 2019-03-17 MED ORDER — AMIODARONE HCL 400 MG PO TABS: 400.0000 mg | ORAL_TABLET | Freq: Every day | ORAL | 0 refills | Status: AC

## 2019-03-17 MED ORDER — NITROGLYCERIN 0.4 MG SL SUBL: 0.4000 mg | SUBLINGUAL_TABLET | SUBLINGUAL | 12 refills | Status: AC | PRN

## 2019-03-17 MED ORDER — POTASSIUM CHLORIDE 20 MEQ PO PACK
40.0000 meq | PACK | Freq: Once | ORAL | Status: AC
  Administered 2019-03-17: 12:00:00 40 meq via ORAL
  Filled 2019-03-17: qty 2

## 2019-03-17 MED ORDER — SPIRONOLACTONE 25 MG PO TABS: 12.5000 mg | ORAL_TABLET | Freq: Every day | ORAL | 0 refills | Status: AC

## 2019-03-17 NOTE — Consult Note (Signed)
PHARMACY CONSULT NOTE - FOLLOW UP  Pharmacy Consult for Electrolyte Monitoring and Replacement   Recent Labs: Potassium (mmol/L)  Date Value  03/17/2019 3.3 (L)  08/31/2012 3.3 (L)   Magnesium (mg/dL)  Date Value  03/16/2019 2.2   Calcium (mg/dL)  Date Value  03/17/2019 8.5 (L)   Calcium, Total (mg/dL)  Date Value  08/31/2012 8.8   Albumin (g/dL)  Date Value  03/13/2019 4.2  08/31/2012 3.6   Sodium (mmol/L)  Date Value  03/17/2019 140  08/31/2012 131 (L)   John Powell  is a 63 y.o. male with a known history of congestive heart failure, COPD, coronary artery disease, diabetes, hypertension.  He has had progressive worsening in his symptoms for the last few months where he felt increasingly short of breath with minimal exertion with palpitations on and off. Pt went into afib and taken off of amiodarone drip. Pt is on KCl 40 mEq BID inpatient.  Lasix has been weaned to 20 mg IV daily, and patient has been started on Entresto 24-26 mg BID on 9/29, and started on spironolactone 12.5 mg daily 9/30.   Assessment: K+ 3.3.  Currently subtherapeutic.    Mg 2.2.  Currently above goal  Goal of Therapy:  K+ 4, Mg 2.   Plan:  Will continue KCl 40 mEq BID, and order KCl 40 mEq PO x 1  F/u with K+ level and magnesium with AM labs.   Gerald Dexter, PharmD Pharmacy Resident  03/17/2019 9:34 AM

## 2019-03-17 NOTE — Progress Notes (Signed)
[facility] [place] NAME: John Powell    MR#:  [id number]  DATE OF BIRTH:  [birth year]  SUBJECTIVE:  Shortness of breath has improved.  Patient still in atrial fibrillation.  Remains on amiodarone drip.  REVIEW OF SYSTEMS:    Review of Systems  Constitutional: Negative for fever, chills weight loss HENT: Negative for ear pain, nosebleeds, congestion, facial swelling, rhinorrhea, neck pain, neck stiffness and ear discharge.   Respiratory: Negative for cough, resolved shortness of breath, wheezing  Cardiovascular: Negative for chest pain, palpitations and leg swelling.  Gastrointestinal: Negative for heartburn, abdominal pain, vomiting, diarrhea or consitpation Genitourinary: Negative for dysuria, urgency, frequency, hematuria Musculoskeletal: Negative for back pain or joint pain Neurological: Negative for dizziness, seizures, syncope, focal weakness,  numbness and headaches.  Hematological: Does not bruise/bleed easily.  Psychiatric/Behavioral: Negative for hallucinations, confusion, dysphoric mood    Tolerating Diet: yes   DRUG ALLERGIES:  No Known Allergies  VITALS:  Blood pressure (!) 140/93, pulse (!) 54, temperature (!) 97.5 F (36.4 C), temperature source Oral, resp. rate 19, height 5\' 1"  (1.549 m), weight (!) 148.8 kg, SpO2 98 %.  PHYSICAL EXAMINATION:  Constitutional: Appears well-developed and well-nourished. No distress. HENT: Normocephalic. Marland Kitchen Oropharynx is clear and moist.  Eyes: Conjunctivae and EOM are normal. PERRLA, no scleral icterus.  Neck: Normal ROM. Neck supple. No JVD. No tracheal deviation. CVS: tachy irr, irr  S1/S2 +, no murmurs, no gallops, no carotid bruit.  Pulmonary: Effort and breath sounds normal, no stridor, rhonchi, wheezes, rales.  Abdominal: Soft. BS +,  no distension, tenderness, rebound or guarding.  Musculoskeletal: Normal range of motion. No edema and no tenderness.  Neuro: Alert. CN 2-12  grossly intact. No focal deficits. Skin: Skin is warm and dry. No rash noted. Psychiatric: Normal mood and affect.    LABORATORY PANEL:   CBC Recent Labs  Lab 03/17/19 0337  WBC 9.1  HGB 13.0  HCT 39.5  PLT 255   ------------------------------------------------------------------------------------------------------------------  Chemistries  Recent Labs  Lab 03/13/19 1710  03/16/19 0604 03/17/19 0337  NA 140   < > 143 140  K 3.5   < > 3.5 3.3*  CL 105   < > 103 106  CO2 24   < > 28 25  GLUCOSE 87   < > 156* 143*  BUN 22   < > 26* 28*  CREATININE 1.37*   < > 1.52* 1.60*  CALCIUM 9.0   < > 9.0 8.5*  MG  --    < > 2.2  --   AST 21  --   --   --   ALT 29  --   --   --   ALKPHOS 72  --   --   --   BILITOT 1.5*  --   --   --    < > = values in this interval not displayed.   ------------------------------------------------------------------------------------------------------------------  Cardiac Enzymes No results for input(s): TROPONINI in the last 168 hours. ------------------------------------------------------------------------------------------------------------------  RADIOLOGY:  No results found.   ASSESSMENT AND PLAN:    63 year old male with static heart failure presents emergency room due to shortness of breath.  1.  Acute on chronic combined systolic and diastolic heart failure with ejection fraction of 20 to 25% with possible thrombus noted on echocardiogram: Decreased dose of Lasix since creatinine has increased.  Patient is 3600 mL negative.    Continue to monitor intake and output Daily weight CHF clinic  upon discharge Continue beta-blocker/Entresto May need AICD in near future. Appreciated held by cardiologist.   2.  Atrial fibrillation with RVR/NSVT: Continue amiodarone drip. Continue Coreg. Continue Eliquis for stroke prevention  3. Hypokalemia: Replete  GOAL K>4.0  Mg -> Keep >2  4.  Chronic kidney disease stage III: Creatinine  relatively stable Recheck in am  5.  Diabetes, A1c 5.8: Continue sliding scale    Management plans discussed with the patient and he is in agreement.  CODE STATUS: full  TOTAL TIME TAKING CARE OF THIS PATIENT: 37 minutes.     POSSIBLE D/C 2-3 days, DEPENDING ON CLINICAL CONDITION.   Altamese Dilling M.D on 03/17/2019 at 8:44 AM  Between 7am to 6pm - Pager - [phone] After 6pm go to [email or web address] - password [email or web address]  [facility]  Office  [phone]  CC: Primary care physician; [facility]  Note: This dictation was prepared with Dragon dictation along with smaller phrase technology. Any transcriptional errors that result from this process are unintentional.

## 2019-03-17 NOTE — Consult Note (Signed)
  Amiodarone Drug - Drug Interaction Consult Note  Recommendations: Amiodarone drip discontinued, but transitioning to amiodarone 400 mg PO daily.  QTc 564 but pt in afib (not reliable reading).  Prn zofran d/c'ed. Holding glipizide. There is an increase risk of hypoglycemia with glipizide + amiodarone. Pt is also on coreg, continue to monitor for bradycardia. Pt is on furosemide, monitor potassium and magnesium.  Patient has started entresto, monitor potassium. Potassium currently 3.3. Pt is on KCl 40 mEq BID and receiving KCl 40 mEq PO x 1 dose.  Pt is also on PRN duonebs, continue to monitor QTc and HR.  Amiodarone is metabolized by the cytochrome P450 system and therefore has the potential to cause many drug interactions. Amiodarone has an average plasma half-life of 50 days (range 20 to 100 days).   There is potential for drug interactions to occur several weeks or months after stopping treatment and the onset of drug interactions may be slow after initiating amiodarone.   [x]  Statins: Increased risk of myopathy. Simvastatin- restrict dose to 20mg  daily. Other statins: counsel patients to report any muscle pain or weakness immediately. On atorvastatin.   []  Anticoagulants: Amiodarone can increase anticoagulant effect. Consider warfarin dose reduction. Patients should be monitored closely and the dose of anticoagulant altered accordingly, remembering that amiodarone levels take several weeks to stabilize.  []  Antiepileptics: Amiodarone can increase plasma concentration of phenytoin, the dose should be reduced. Note that small changes in phenytoin dose can result in large changes in levels. Monitor patient and counsel on signs of toxicity.  [x]  Beta blockers: increased risk of bradycardia, AV block and myocardial depression. Sotalol - avoid concomitant use. ON coreg.   []   Calcium channel blockers (diltiazem and verapamil): increased risk of bradycardia, AV block and myocardial  depression.  []   Cyclosporine: Amiodarone increases levels of cyclosporine. Reduced dose of cyclosporine is recommended.  []  Digoxin dose should be halved when amiodarone is started.  [x]  Diuretics: increased risk of cardiotoxicity if hypokalemia occurs. On furosemide.   [x]  Oral hypoglycemic agents (glyburide, glipizide, glimepiride): increased risk of hypoglycemia. Patient's glucose levels should be monitored closely when initiating amiodarone therapy. On glipizide PTA.   [x]  Drugs that prolong the QT interval:  Torsades de pointes risk may be increased with concurrent use - avoid if possible.  Monitor QTc, also keep magnesium/potassium WNL if concurrent therapy can't be avoided.  On PRN trazodone . Antibiotics: e.g. fluoroquinolones, erythromycin. . Antiarrhythmics: e.g. quinidine, procainamide, disopyramide, sotalol. . Antipsychotics: e.g. phenothiazines, haloperidol.  . Lithium, tricyclic antidepressants, and methadone. Thank You,  Gerald Dexter  03/17/2019 9:41 AM

## 2019-03-17 NOTE — Discharge Summary (Signed)
[facility] - [place] at [facility]   PATIENT NAME: John Powell    MR#:  [id number]  DATE OF BIRTH:  08/25/1955  DATE OF ADMISSION:  03/13/2019 ADMITTING PHYSICIAN: Altamese Dilling, MD  DATE OF DISCHARGE: 03/17/2019   PRIMARY CARE PHYSICIAN: [facility]    ADMISSION DIAGNOSIS:  Dyspnea, unspecified type [R06.00] Congestive heart failure, unspecified HF chronicity, unspecified heart failure type (HCC) [I50.9]  DISCHARGE DIAGNOSIS:  Principal Problem:   Acute CHF (congestive heart failure) (HCC) Active Problems:   Acute on chronic diastolic CHF (congestive heart failure) (HCC)   SECONDARY DIAGNOSIS:   Past Medical History:  Diagnosis Date  . CHF (congestive heart failure) (HCC)   . COPD (chronic obstructive pulmonary disease) (HCC)   . Coronary artery disease   . Diabetes mellitus without complication (HCC)   . Hypertension     HOSPITAL COURSE:   63 year old male with static heart failure presents emergency room due to shortness of breath.  1.  Acute on chronic combined systolic and diastolic heart failure with ejection fraction of 20 to 25% with possible thrombus noted on echocardiogram: Decreased dose of Lasix since creatinine has increased.  Patient is 3600 mL negative.    Continue to monitor intake and output Daily weight CHF clinic upon discharge Continue beta-blocker/Entresto May need AICD in near future. Appreciated consult by cardiologist.   2.  Atrial fibrillation with RVR/NSVT: Continue amiodarone drip. Continue Coreg. Continue Eliquis for stroke prevention  3. Hypokalemia: Replete  GOAL K>4.0  Mg -> Keep >2  4.  Chronic kidney disease stage III: Creatinine relatively stable Recheck in am  5.  Diabetes, A1c 5.8: Continue sliding scale   DISCHARGE CONDITIONS:   Stable.  CONSULTS OBTAINED:  Treatment Team:  Laurier Nancy, MD  DRUG ALLERGIES:  No Known Allergies  DISCHARGE  MEDICATIONS:   Allergies as of 03/17/2019   No Known Allergies     Medication List    STOP taking these medications   hydrochlorothiazide 25 MG tablet Commonly known as: HYDRODIURIL   losartan 100 MG tablet Commonly known as: COZAAR     TAKE these medications   allopurinol 100 MG tablet Commonly known as: ZYLOPRIM Take 100 mg by mouth 2 (two) times daily.   amiodarone 400 MG tablet Commonly known as: PACERONE Take 1 tablet (400 mg total) by mouth daily. Start taking on: March 18, 2019   apixaban 5 MG Tabs tablet Commonly known as: ELIQUIS Take 1 tablet (5 mg total) by mouth 2 (two) times daily.   atorvastatin 40 MG tablet Commonly known as: LIPITOR Take 20 mg by mouth at bedtime.   carvedilol 12.5 MG tablet Commonly known as: COREG Take 1 tablet (12.5 mg total) by mouth 2 (two) times daily with a meal. What changed:  medication strength how much to take   furosemide 20 MG tablet Commonly known as: LASIX Take 1 tablet (20 mg total) by mouth daily. Start taking on: March 18, 2019   glipiZIDE 5 MG tablet Commonly known as: GLUCOTROL Take 5 mg by mouth 2 (two) times daily.   hydrALAZINE 50 MG tablet Commonly known as: APRESOLINE Take 1 tablet (50 mg total) by mouth 2 (two) times daily. What changed:  medication strength how much to take   isosorbide mononitrate 60 MG 24 hr tablet Commonly known as: IMDUR Take 30 mg by mouth 2 (two) times daily.   metFORMIN 1000 MG tablet Commonly known as: GLUCOPHAGE Take 1,000 mg by mouth 2 (two)  times daily with a meal.   nitroGLYCERIN 0.4 MG SL tablet Commonly known as: NITROSTAT Place 1 tablet (0.4 mg total) under the tongue every 5 (five) minutes as needed for chest pain.   potassium chloride 20 MEQ packet Commonly known as: KLOR-CON Take 20 mEq by mouth 2 (two) times daily.   sacubitril-valsartan 24-26 MG Commonly known as: ENTRESTO Take 1 tablet by mouth 2 (two) times daily.   spironolactone 25 MG  tablet Commonly known as: ALDACTONE Take 0.5 tablets (12.5 mg total) by mouth daily. Start taking on: March 18, 2019   SUPER B COMPLEX PO Take 1 tablet by mouth daily.        DISCHARGE INSTRUCTIONS:    Follow with cardio clinic in 1-2 weeks.  If you experience worsening of your admission symptoms, develop shortness of breath, life threatening emergency, suicidal or homicidal thoughts you must seek medical attention immediately by calling 911 or calling your MD immediately  if symptoms less severe.  You Must read complete instructions/literature along with all the possible adverse reactions/side effects for all the Medicines you take and that have been prescribed to you. Take any new Medicines after you have completely understood and accept all the possible adverse reactions/side effects.   Please note  You were cared for by a hospitalist during your hospital stay. If you have any questions about your discharge medications or the care you received while you were in the hospital after you are discharged, you can call the unit and asked to speak with the hospitalist on call if the hospitalist that took care of you is not available. Once you are discharged, your primary care physician will handle any further medical issues. Please note that NO REFILLS for any discharge medications will be authorized once you are discharged, as it is imperative that you return to your primary care physician (or establish a relationship with a primary care physician if you do not have one) for your aftercare needs so that they can reassess your need for medications and monitor your lab values.    Today   CHIEF COMPLAINT:   Chief Complaint  Patient presents with  . Shortness of Breath    HISTORY OF PRESENT ILLNESS:  John Powell  is a 63 y.o. male with a known history of congestive heart failure, COPD, coronary artery disease, diabetes, hypertension-has progressive worsening in his symptoms for last  few month where he feels increasingly short of breath with minimal exertion and have palpitation feelings on and off.  He uses CPAP at night. For last 2-3 nights it is extremely difficult and he could not sleep at all in spite of using CPAP at night so decided to come to emergency room today.  He denies any chest pain but has feeling of palpitation repeatedly.  He denies any swelling on his legs. He claims to be taking all the medications regularly as advised by his physicians.    VITAL SIGNS:  Blood pressure (!) 140/93, pulse (!) 54, temperature (!) 97.5 F (36.4 C), temperature source Oral, resp. rate 19, height 5\' 1"  (1.549 m), weight (!) 148.8 kg, SpO2 98 %.  I/O:    Intake/Output Summary (Last 24 hours) at 03/17/2019 1305 Last data filed at 03/17/2019 0700 Gross per 24 hour  Intake 633.53 ml  Output 350 ml  Net 283.53 ml    PHYSICAL EXAMINATION:   Constitutional: Appears well-developed and well-nourished. No distress. HENT: Normocephalic. Marland Kitchen. Oropharynx is clear and moist.  Eyes: Conjunctivae and EOM are  normal. PERRLA, no scleral icterus.  Neck: Normal ROM. Neck supple. No JVD. No tracheal deviation. CVS: tachy irr, irr  S1/S2 +, no murmurs, no gallops, no carotid bruit.  Pulmonary: Effort and breath sounds normal, no stridor, rhonchi, wheezes, rales.  Abdominal: Soft. BS +,  no distension, tenderness, rebound or guarding.  Musculoskeletal: Normal range of motion. No edema and no tenderness.  Neuro: Alert. CN 2-12 grossly intact. No focal deficits. Skin: Skin is warm and dry. No rash noted. Psychiatric: Normal mood and affect.    DATA REVIEW:   CBC Recent Labs  Lab 03/17/19 0337  WBC 9.1  HGB 13.0  HCT 39.5  PLT 255    Chemistries  Recent Labs  Lab 03/13/19 1710  03/16/19 0604 03/17/19 0337  NA 140   < > 143 140  K 3.5   < > 3.5 3.3*  CL 105   < > 103 106  CO2 24   < > 28 25  GLUCOSE 87   < > 156* 143*  BUN 22   < > 26* 28*  CREATININE 1.37*   < > 1.52*  1.60*  CALCIUM 9.0   < > 9.0 8.5*  MG  --    < > 2.2  --   AST 21  --   --   --   ALT 29  --   --   --   ALKPHOS 72  --   --   --   BILITOT 1.5*  --   --   --    < > = values in this interval not displayed.    Cardiac Enzymes No results for input(s): TROPONINI in the last 168 hours.  Microbiology Results  Results for orders placed or performed during the hospital encounter of 03/13/19  SARS Coronavirus 2 [facility] order, Performed in [facility] hospital lab) Nasopharyngeal Nasopharyngeal Swab     Status: None   Collection Time: 03/13/19  7:21 PM   Specimen: Nasopharyngeal Swab  Result Value Ref Range Status   SARS Coronavirus 2 NEGATIVE NEGATIVE Final    Comment: (NOTE) If result is NEGATIVE SARS-CoV-2 target nucleic acids are NOT DETECTED. The SARS-CoV-2 RNA is generally detectable in upper and lower  respiratory specimens during the acute phase of infection. The lowest  concentration of SARS-CoV-2 viral copies this assay can detect is 250  copies / mL. A negative result does not preclude SARS-CoV-2 infection  and should not be used as the sole basis for treatment or other  patient management decisions.  A negative result may occur with  improper specimen collection / handling, submission of specimen other  than nasopharyngeal swab, presence of viral mutation(s) within the  areas targeted by this assay, and inadequate number of viral copies  (<250 copies / mL). A negative result must be combined with clinical  observations, patient history, and epidemiological information. If result is POSITIVE SARS-CoV-2 target nucleic acids are DETECTED. The SARS-CoV-2 RNA is generally detectable in upper and lower  respiratory specimens dur ing the acute phase of infection.  Positive  results are indicative of active infection with SARS-CoV-2.  Clinical  correlation with patient history and other diagnostic information is  necessary to determine patient infection status.  Positive  results do  not rule out bacterial infection or co-infection with other viruses. If result is PRESUMPTIVE POSTIVE SARS-CoV-2 nucleic acids MAY BE PRESENT.   A presumptive positive result was obtained on the submitted specimen  and confirmed on repeat testing.  While  2019 novel coronavirus  (SARS-CoV-2) nucleic acids may be present in the submitted sample  additional confirmatory testing may be necessary for epidemiological  and / or clinical management purposes  to differentiate between  SARS-CoV-2 and other Sarbecovirus currently known to infect humans.  If clinically indicated additional testing with an alternate test  methodology [phone]) is advised. The SARS-CoV-2 RNA is generally  detectable in upper and lower respiratory sp ecimens during the acute  phase of infection. The expected result is Negative. Fact Sheet for Patients:  StrictlyIdeas.no Fact Sheet for Healthcare Providers: BankingDealers.co.za This test is not yet approved or cleared by the [place] FDA and has been authorized for detection and/or diagnosis of SARS-CoV-2 by FDA under an Emergency Use Authorization (EUA).  This EUA will remain in effect (meaning this test can be used) for the duration of the COVID-19 declaration under Section 564(b)(1) of the Act, 21 U.S.C. section 360bbb-3(b)(1), unless the authorization is terminated or revoked sooner. Performed at [facility], [address], [place], [place] [address]     RADIOLOGY:  No results found.  EKG:   Orders placed or performed during the hospital encounter of 03/13/19  . ED EKG  . ED EKG  . ED EKG  . ED EKG  . EKG - 12 lead  . EKG - 12 lead      Management plans discussed with the patient, family and they are in agreement.  CODE STATUS: full.    Code Status Orders  (From admission, onward)         Start     Ordered   03/13/19 2152  Full code  Continuous     03/13/19 2151         Code Status History    This patient has a current code status but no historical code status.   Advance Care Planning Activity      TOTAL TIME TAKING CARE OF THIS PATIENT: 35 minutes.    Vaughan Basta M.D on 03/17/2019 at 1:05 PM  Between 7am to 6pm - Pager - [phone]  After 6pm go to [email or web address] - password [email or web address]  Office  [phone]  CC: Primary care physician; [place]   Note: This dictation was prepared with Dragon dictation along with smaller phrase technology. Any transcriptional errors that result from this process are unintentional.

## 2019-03-17 NOTE — Care Management Important Message (Signed)
Important Message  Patient Details  Name: John Powell MRN: [id number] Date of Birth: 02/26/1956   Medicare Important Message Given:  Yes     Dannette Barbara 03/17/2019, 12:24 PM

## 2019-03-17 NOTE — Progress Notes (Signed)
SUBJECTIVE: Pt is feeling well, no palpations or shortness of breath.   Vitals:   03/16/19 1954 03/16/19 2225 03/17/19 0327 03/17/19 0753  BP: (!) 144/92  (!) 141/84 (!) 140/93  Pulse: (!) 57 (!) 56 (!) 57 (!) 54  Resp: 18 17  19   Temp: 97.7 F (36.5 C)  (!) 97.5 F (36.4 C) (!) 97.5 F (36.4 C)  TempSrc: Oral  Oral Oral  SpO2: 98%  99% 98%  Weight:   (!) 148.8 kg   Height:        Intake/Output Summary (Last 24 hours) at 03/17/2019 1034 Last data filed at 03/17/2019 0700 Gross per 24 hour  Intake 636.53 ml  Output 475 ml  Net 161.53 ml    LABS: Basic Metabolic Panel: Recent Labs    03/15/19 0518  03/16/19 0604 03/17/19 0337  NA 142  --  143 140  K 2.9*   < > 3.5 3.3*  CL 104  --  103 106  CO2 23  --  28 25  GLUCOSE 142*  --  156* 143*  BUN 25*  --  26* 28*  CREATININE 1.50*  --  1.52* 1.60*  CALCIUM 9.0  --  9.0 8.5*  MG 1.8  --  2.2  --    < > = values in this interval not displayed.   Liver Function Tests: No results for input(s): AST, ALT, ALKPHOS, BILITOT, PROT, ALBUMIN in the last 72 hours. No results for input(s): LIPASE, AMYLASE in the last 72 hours. CBC: Recent Labs    03/17/19 0337  WBC 9.1  HGB 13.0  HCT 39.5  MCV 83.2  PLT 255   Cardiac Enzymes: No results for input(s): CKTOTAL, CKMB, CKMBINDEX, TROPONINI in the last 72 hours. BNP: Invalid input(s): POCBNP D-Dimer: No results for input(s): DDIMER in the last 72 hours. Hemoglobin A1C: No results for input(s): HGBA1C in the last 72 hours. Fasting Lipid Panel: No results for input(s): CHOL, HDL, LDLCALC, TRIG, CHOLHDL, LDLDIRECT in the last 72 hours. Thyroid Function Tests: No results for input(s): TSH, T4TOTAL, T3FREE, THYROIDAB in the last 72 hours.  Invalid input(s): FREET3 Anemia Panel: No results for input(s): VITAMINB12, FOLATE, FERRITIN, TIBC, IRON, RETICCTPCT in the last 72 hours.   PHYSICAL EXAM General: Well developed, well nourished, in no acute distress HEENT:   Normocephalic and atramatic Neck:  No JVD.  Lungs: Clear bilaterally to auscultation and percussion. Heart: HRRR . Normal S1 and S2 without gallops or murmurs.  Abdomen: Bowel sounds are positive, abdomen soft and non-tender  Msk:  Back normal, normal gait. Normal strength and tone for age. Extremities: No clubbing, cyanosis or edema.     TELEMETRY: Sinus bradycardia 56bpm  ASSESSMENT AND PLAN:  CHF with reduced ejection fraction in setting of Atrial fibrillation with RVR:  Converted to sinus bradycardia, switched to oral amiodarone 400mg /day. Continue eliquis and follow up outpatient with Dr. 05/17/19 at [facility], Monday 03/21/19 at 9:30am.   Echo results:   LVEF  is 20 to 25%. There is mildly increased left ventricular hypertrophy.  2. Small, fixed thrombus on the apical wall of the left ventricle.  3. Global right ventricle has moderately reduced systolic function.The right ventricular size is moderately enlarged. No increase in right ventricular wall thickness.  Continue coreg, entresto, and add aldactone. Monitor potassium.   Principal Problem:   Acute CHF (congestive heart failure) (HCC) Active Problems:   Acute on chronic diastolic CHF (congestive heart failure) (HCC)    Thursday, NP-C  03/17/2019 10:34 AM Cell: [phone]

## 2019-03-21 DIAGNOSIS — I420 Dilated cardiomyopathy: Secondary | ICD-10-CM | POA: Diagnosis not present

## 2019-03-21 DIAGNOSIS — R002 Palpitations: Secondary | ICD-10-CM | POA: Diagnosis not present

## 2019-03-21 DIAGNOSIS — G4733 Obstructive sleep apnea (adult) (pediatric): Secondary | ICD-10-CM | POA: Diagnosis not present

## 2019-03-21 DIAGNOSIS — I10 Essential (primary) hypertension: Secondary | ICD-10-CM | POA: Diagnosis not present

## 2019-03-21 DIAGNOSIS — I2510 Atherosclerotic heart disease of native coronary artery without angina pectoris: Secondary | ICD-10-CM | POA: Diagnosis not present

## 2019-03-23 NOTE — Progress Notes (Signed)
Patient ID: John Powell, male    DOB: 1955/12/26, 63 y.o.   MRN: [id number]  HPI  [name] is a 63 y/o male with a history of HTN, DM, CAD, COPD, sleep apnea and chronic HF.   Echo report from 02/22/2019 reviewed and showed an EF of 20-25% along with small, fixed apical thrombus, trace MR, mild AR and normal PA pressure.  Admitted 03/13/2019 due to acute on chronic heart failure. Cardiology consult obtained. Discharged after 4 days.   He presents today for his initial visit with a chief complaint of minimal fatigue upon moderate exertion. He describes this as chronic in nature having been present for several years. He has associated shortness of breath along with this. He denies any difficulty sleeping, dizziness, abdominal distention, palpitations, pedal edema, chest pain, cough or weight gain.   Was told to stop his HCTZ (which he did) and begin furosemide/spirononolactone. He hasn't received those 2 medications from the [place] yet.   Past Medical History:  Diagnosis Date  . CHF (congestive heart failure) ([place])   . COPD (chronic obstructive pulmonary disease) ([place])   . Coronary artery disease   . Diabetes mellitus without complication ([place])   . Hypertension   . Sleep apnea    Past Surgical History:  Procedure Laterality Date  . CHOLECYSTECTOMY     Family History  Problem Relation Age of Onset  . Hypertension Mother    Social History   Tobacco Use  . Smoking status: Never Smoker  . Smokeless tobacco: Never Used  Substance Use Topics  . Alcohol use: Not on file   No Known Allergies Prior to Admission medications   Medication Sig Start Date End Date Taking? Authorizing Provider  allopurinol (ZYLOPRIM) 100 MG tablet Take 100 mg by mouth 2 (two) times daily.   Yes [provider]  amiodarone (PACERONE) 400 MG tablet Take 1 tablet (400 mg total) by mouth daily. 03/18/19  Yes Vaughan Basta, MD  apixaban (ELIQUIS) 5 MG TABS tablet Take 1 tablet (5 mg total) by  mouth 2 (two) times daily. 03/17/19  Yes Vaughan Basta, MD  atorvastatin (LIPITOR) 40 MG tablet Take 20 mg by mouth at bedtime.   Yes [provider]  B Complex-C (SUPER B COMPLEX PO) Take 1 tablet by mouth daily.   Yes [provider]  carvedilol (COREG) 12.5 MG tablet Take 1 tablet (12.5 mg total) by mouth 2 (two) times daily with a meal. 03/17/19  Yes Vaughan Basta, MD  glipiZIDE (GLUCOTROL) 5 MG tablet Take 5 mg by mouth 2 (two) times daily.   Yes [provider]  hydrALAZINE (APRESOLINE) 50 MG tablet Take 1 tablet (50 mg total) by mouth 2 (two) times daily. 03/17/19  Yes Vaughan Basta, MD  isosorbide mononitrate (IMDUR) 60 MG 24 hr tablet Take 30 mg by mouth 2 (two) times daily.   Yes [provider]  metFORMIN (GLUCOPHAGE) 1000 MG tablet Take 1,000 mg by mouth 2 (two) times daily with a meal.   Yes [provider]  nitroGLYCERIN (NITROSTAT) 0.4 MG SL tablet Place 1 tablet (0.4 mg total) under the tongue every 5 (five) minutes as needed for chest pain. 03/17/19  Yes Vaughan Basta, MD  potassium chloride (KLOR-CON) 20 MEQ packet Take 20 mEq by mouth 2 (two) times daily.   Yes [provider]  sacubitril-valsartan (ENTRESTO) 24-26 MG Take 1 tablet by mouth 2 (two) times daily. 03/17/19  Yes Vaughan Basta, MD  furosemide (LASIX) 20 MG tablet  Take 1 tablet (20 mg total) by mouth daily. Patient not taking: Reported on 03/24/2019 03/18/19   Altamese DillingVachhani, Vaibhavkumar, MD  hydrochlorothiazide (HYDRODIURIL) 25 MG tablet Take 25 mg by mouth daily. Patient not taking    [provider]  spironolactone (ALDACTONE) 25 MG tablet Take 0.5 tablets (12.5 mg total) by mouth daily. Patient not taking: Reported on 03/24/2019 03/18/19   Altamese DillingVachhani, Vaibhavkumar, MD    Review of Systems  Constitutional: Positive for fatigue (improving). Negative for appetite change.  HENT: Negative for congestion, postnasal drip and sore  throat.   Eyes: Negative.   Respiratory: Positive for shortness of breath. Negative for cough.   Cardiovascular: Negative for chest pain, palpitations and leg swelling.  Gastrointestinal: Negative for abdominal distention and abdominal pain.  Endocrine: Negative.   Genitourinary: Negative.   Musculoskeletal: Negative for back pain and neck pain.  Skin: Negative.   Allergic/Immunologic: Negative.   Neurological: Negative for dizziness and light-headedness.  Hematological: Negative for adenopathy. Does not bruise/bleed easily.  Psychiatric/Behavioral: Negative for dysphoric mood and sleep disturbance (wearing CPAP at night). The patient is not nervous/anxious.     Vitals:   03/24/19 1153  BP: (!) 162/93  Pulse: 72  Resp: 18  SpO2: 97%  Weight: (!) 329 lb (149.2 kg)  Height: 5\' 11"  (1.803 m)   Wt Readings from Last 3 Encounters:  03/24/19 (!) 329 lb (149.2 kg)  03/17/19 (!) 328 lb (148.8 kg)   Lab Results  Component Value Date   CREATININE 1.60 (H) 03/17/2019   CREATININE 1.52 (H) 03/16/2019   CREATININE 1.50 (H) 03/15/2019    Physical Exam Constitutional:      Appearance: Normal appearance.  HENT:     Head: Normocephalic and atraumatic.  Neck:     Musculoskeletal: Normal range of motion and neck supple.  Cardiovascular:     Rate and Rhythm: Normal rate and regular rhythm.  Pulmonary:     Effort: Pulmonary effort is normal. No respiratory distress.     Breath sounds: No wheezing or rales.  Abdominal:     General: There is no distension.     Palpations: Abdomen is soft.     Tenderness: There is no abdominal tenderness.  Musculoskeletal:        General: No tenderness.     Right lower leg: No edema.     Left lower leg: No edema.  Skin:    General: Skin is warm and dry.  Neurological:     General: No focal deficit present.     Mental Status: He is alert and oriented to person, place, and time.  Psychiatric:        Mood and Affect: Mood normal.        Behavior:  Behavior normal.        Thought Content: Thought content normal.    Assessment & Plan:  1: Chronic heart failure with reduced ejection fraction- - NYHA class II - euvolemic today - weighing daily and says that his home weight is around 325 pounds; reminded to call for an overnight weight gain of >2 pounds or a weekly weight gain of >5 pounds - not adding salt and has been reading food labels for sodium content; understands to keep sodium content to 2000mg  sodium/ day - sees cardiology Welton Flakes([place]) & saw him recently - until he receives his furosem[place] and [facility], he should resume his HCTZ and then stop it when he receives the medications. Patient verbalized understanding.  - BNP 03/13/2019 was  476.0 - consider titrating up entresto & carvedilol if able - consider adding farxiga at future visits; difficulty will be in obtaining from the [place] if a [place] provider doesn't write the RX  2: HTN- - BP elevated today but he says that he was a little nervous and he's not been taking any diuretic - sees PCP - BMP from 03/17/2019 reviewed and showed sodium 140, potassium 3.3, creatinine 1.6 and GFR 45  3: DM- - A1c 03/13/2019 was 5.8% - glucose at home was 95 this morning  4: AF with RVR/ NSVT- - on amiodarone and apixaban - potassium goal K>4.0 - Mg -> Keep >2  Medication bottles reviewed.  Return in 6 weeks or sooner for any questions/problems before then.

## 2019-03-24 ENCOUNTER — Encounter: Payer: Self-pay | Admitting: Family

## 2019-03-24 ENCOUNTER — Other Ambulatory Visit: Payer: Self-pay

## 2019-03-24 ENCOUNTER — Ambulatory Visit: Payer: Medicare Other | Attending: Family | Admitting: Family

## 2019-03-24 VITALS — BP 162/93 | HR 72 | Resp 18 | Ht 71.0 in | Wt 329.0 lb

## 2019-03-24 DIAGNOSIS — I5022 Chronic systolic (congestive) heart failure: Secondary | ICD-10-CM | POA: Insufficient documentation

## 2019-03-24 DIAGNOSIS — Z7901 Long term (current) use of anticoagulants: Secondary | ICD-10-CM | POA: Insufficient documentation

## 2019-03-24 DIAGNOSIS — R0602 Shortness of breath: Secondary | ICD-10-CM | POA: Diagnosis not present

## 2019-03-24 DIAGNOSIS — Z79899 Other long term (current) drug therapy: Secondary | ICD-10-CM | POA: Diagnosis not present

## 2019-03-24 DIAGNOSIS — I480 Paroxysmal atrial fibrillation: Secondary | ICD-10-CM

## 2019-03-24 DIAGNOSIS — Z7984 Long term (current) use of oral hypoglycemic drugs: Secondary | ICD-10-CM | POA: Insufficient documentation

## 2019-03-24 DIAGNOSIS — I472 Ventricular tachycardia: Secondary | ICD-10-CM | POA: Diagnosis not present

## 2019-03-24 DIAGNOSIS — I2510 Atherosclerotic heart disease of native coronary artery without angina pectoris: Secondary | ICD-10-CM | POA: Diagnosis not present

## 2019-03-24 DIAGNOSIS — E119 Type 2 diabetes mellitus without complications: Secondary | ICD-10-CM | POA: Diagnosis not present

## 2019-03-24 DIAGNOSIS — J449 Chronic obstructive pulmonary disease, unspecified: Secondary | ICD-10-CM | POA: Diagnosis not present

## 2019-03-24 DIAGNOSIS — I110 Hypertensive heart disease with heart failure: Secondary | ICD-10-CM | POA: Diagnosis not present

## 2019-03-24 DIAGNOSIS — N1831 Chronic kidney disease, stage 3a: Secondary | ICD-10-CM

## 2019-03-24 DIAGNOSIS — R5383 Other fatigue: Secondary | ICD-10-CM | POA: Insufficient documentation

## 2019-03-24 DIAGNOSIS — I10 Essential (primary) hypertension: Secondary | ICD-10-CM

## 2019-03-24 NOTE — Patient Instructions (Signed)
Continue weighing daily and call for an overnight weight gain of > 2 pounds or a weekly weight gain of >5 pounds. 

## 2019-05-03 NOTE — Progress Notes (Signed)
Patient ID: John Powell, male    DOB: [birth year], 63 y.o.   MRN: [id number]  HPI  Mr Sahli is a 63 y/o male with a history of HTN, DM, CAD, COPD, sleep apnea and chronic HF.   Echo report from 02/22/2019 reviewed and showed an EF of 20-25% along with small, fixed apical thrombus, trace MR, mild AR and normal PA pressure.  Admitted 03/13/2019 due to acute on chronic heart failure. Cardiology consult obtained. Discharged after 4 days.   He presents today for a follow-up visit with a chief complaint of moderate fatigue upon minimal exertion. He describes this as chronic in nature having been present for several months. He has associated shortness of breath, palpitations and light-headedness along with this. He denies any difficulty sleeping, abdominal distention, pedal edema, chest pain, cough or weight gain.   Had lab work at the [place] on 04/25/2019 after he'd been taking spironolactone & furosemide. HCTZ has been stopped. He does not have a copy of the lab results.   Past Medical History:  Diagnosis Date  . CHF (congestive heart failure) (HCC)   . COPD (chronic obstructive pulmonary disease) (HCC)   . Coronary artery disease   . Diabetes mellitus without complication (HCC)   . Hypertension   . Sleep apnea    Past Surgical History:  Procedure Laterality Date  . CHOLECYSTECTOMY     Family History  Problem Relation Age of Onset  . Hypertension Mother    Social History   Tobacco Use  . Smoking status: Never Smoker  . Smokeless tobacco: Never Used  Substance Use Topics  . Alcohol use: Not on file   No Known Allergies  Prior to Admission medications   Medication Sig Start Date End Date Taking? Authorizing Provider  allopurinol (ZYLOPRIM) 100 MG tablet Take 100 mg by mouth 2 (two) times daily.   Yes [provider]  amiodarone (PACERONE) 400 MG tablet Take 1 tablet (400 mg total) by mouth daily. 03/18/19  Yes Altamese Dilling, MD  apixaban (ELIQUIS) 5 MG TABS tablet  Take 1 tablet (5 mg total) by mouth 2 (two) times daily. 03/17/19  Yes Altamese Dilling, MD  atorvastatin (LIPITOR) 40 MG tablet Take 20 mg by mouth at bedtime.   Yes [provider]  B Complex-C (SUPER B COMPLEX PO) Take 1 tablet by mouth daily.   Yes [provider]  carvedilol (COREG) 12.5 MG tablet Take 1 tablet (12.5 mg total) by mouth 2 (two) times daily with a meal. 03/17/19  Yes Altamese Dilling, MD  furosemide (LASIX) 20 MG tablet Take 1 tablet (20 mg total) by mouth daily. 03/18/19  Yes Altamese Dilling, MD  glipiZIDE (GLUCOTROL) 5 MG tablet Take 5 mg by mouth 2 (two) times daily.   Yes [provider]  hydrALAZINE (APRESOLINE) 50 MG tablet Take 1 tablet (50 mg total) by mouth 2 (two) times daily. 03/17/19  Yes Altamese Dilling, MD  isosorbide mononitrate (IMDUR) 60 MG 24 hr tablet Take 30 mg by mouth 2 (two) times daily.   Yes [provider]  metFORMIN (GLUCOPHAGE) 1000 MG tablet Take 1,000 mg by mouth 2 (two) times daily with a meal.   Yes [provider]  nitroGLYCERIN (NITROSTAT) 0.4 MG SL tablet Place 1 tablet (0.4 mg total) under the tongue every 5 (five) minutes as needed for chest pain. 03/17/19  Yes Altamese Dilling, MD  potassium chloride (KLOR-CON) 20 MEQ packet Take 20 mEq by mouth 2 (two) times daily.  Yes [provider]  sacubitril-valsartan (ENTRESTO) 24-26 MG Take 1 tablet by mouth 2 (two) times daily. 03/17/19  Yes Altamese DillingVachhani, Vaibhavkumar, MD  spironolactone (ALDACTONE) 25 MG tablet Take 0.5 tablets (12.5 mg total) by mouth daily. 03/18/19  Yes Altamese DillingVachhani, Vaibhavkumar, MD    Review of Systems  Constitutional: Positive for fatigue (easily). Negative for appetite change.  HENT: Negative for congestion, postnasal drip and sore throat.   Eyes: Negative.   Respiratory: Positive for shortness of breath. Negative for cough.   Cardiovascular: Positive for palpitations (at times). Negative for chest  pain and leg swelling.  Gastrointestinal: Negative for abdominal distention and abdominal pain.  Endocrine: Negative.   Genitourinary: Negative.   Musculoskeletal: Negative for back pain and neck pain.  Skin: Negative.   Allergic/Immunologic: Negative.   Neurological: Positive for light-headedness (at times). Negative for dizziness.  Hematological: Negative for adenopathy. Does not bruise/bleed easily.  Psychiatric/Behavioral: Negative for dysphoric mood and sleep disturbance (wearing CPAP at night). The patient is not nervous/anxious.    Vitals:   05/04/19 1012  BP: (!) 156/88  Pulse: 68  Resp: 20  SpO2: 99%  Weight: (!) 331 lb (150.1 kg)  Height: 6' (1.829 m)   Wt Readings from Last 3 Encounters:  05/04/19 (!) 331 lb (150.1 kg)  03/24/19 (!) 329 lb (149.2 kg)  03/17/19 (!) 328 lb (148.8 kg)   Lab Results  Component Value Date   CREATININE 1.60 (H) 03/17/2019   CREATININE 1.52 (H) 03/16/2019   CREATININE 1.50 (H) 03/15/2019    Physical Exam Constitutional:      Appearance: Normal appearance.  HENT:     Head: Normocephalic and atraumatic.  Neck:     Musculoskeletal: Normal range of motion and neck supple.  Cardiovascular:     Rate and Rhythm: Normal rate and regular rhythm.  Pulmonary:     Effort: Pulmonary effort is normal. No respiratory distress.     Breath sounds: No wheezing or rales.  Abdominal:     General: There is no distension.     Palpations: Abdomen is soft.     Tenderness: There is no abdominal tenderness.  Musculoskeletal:        General: No tenderness.     Right lower leg: No edema.     Left lower leg: No edema.  Skin:    General: Skin is warm and dry.  Neurological:     General: No focal deficit present.     Mental Status: He is alert and oriented to person, place, and time.  Psychiatric:        Mood and Affect: Mood normal.        Behavior: Behavior normal.        Thought Content: Thought content normal.    Assessment & Plan:  1:  Chronic heart failure with reduced ejection fraction- - NYHA class III - euvolemic today - weighing daily; reminded to call for an overnight weight gain of >2 pounds or a weekly weight gain of >5 pounds - weight up 2 pounds from last visit here 6 weeks ago - not adding salt and has been reading food labels for sodium content; understands to keep sodium content to 2000mg  sodium/ day - sees cardiology Welton Flakes(Khan) and saw him in the last month or so - BNP 03/13/2019 was 476.0 - patient finally received his [facility]; will defer titration of carvedilol and entresto to Dr. Welton FlakesKhan since it's difficult to get medication adjustments done by non-[facility] providers  2: HTN- -  BP mildly elevated today - sees PCP at the [place]; recently had lab work and, per his report, everything was good - BMP from 03/17/2019 reviewed and showed sodium 140, potassium 3.3, creatinine 1.6 and GFR 45  3: DM- - A1c 03/13/2019 was 5.8% - glucose in clinic today was 125  4: AF with RVR/ NSVT- - on amiodarone and apixaban - potassium goal K>4.0 - Mg -> Keep >2  Patient did not bring her medications nor a list. Each medication was verbally reviewed with the patient and she was encouraged to bring the bottles to every visit to confirm accuracy of list.   Return in 6 months or sooner for any questions/problems before then.

## 2019-05-04 ENCOUNTER — Other Ambulatory Visit: Payer: Self-pay

## 2019-05-04 ENCOUNTER — Ambulatory Visit: Payer: Medicare Other | Attending: Family | Admitting: Family

## 2019-05-04 ENCOUNTER — Encounter: Payer: Self-pay | Admitting: Family

## 2019-05-04 VITALS — BP 156/88 | HR 68 | Resp 20 | Ht 72.0 in | Wt 331.0 lb

## 2019-05-04 DIAGNOSIS — Z8249 Family history of ischemic heart disease and other diseases of the circulatory system: Secondary | ICD-10-CM | POA: Diagnosis not present

## 2019-05-04 DIAGNOSIS — Z9049 Acquired absence of other specified parts of digestive tract: Secondary | ICD-10-CM | POA: Diagnosis not present

## 2019-05-04 DIAGNOSIS — I110 Hypertensive heart disease with heart failure: Secondary | ICD-10-CM | POA: Insufficient documentation

## 2019-05-04 DIAGNOSIS — G4730 Sleep apnea, unspecified: Secondary | ICD-10-CM | POA: Insufficient documentation

## 2019-05-04 DIAGNOSIS — Z79899 Other long term (current) drug therapy: Secondary | ICD-10-CM | POA: Insufficient documentation

## 2019-05-04 DIAGNOSIS — Z7901 Long term (current) use of anticoagulants: Secondary | ICD-10-CM | POA: Diagnosis not present

## 2019-05-04 DIAGNOSIS — I480 Paroxysmal atrial fibrillation: Secondary | ICD-10-CM

## 2019-05-04 DIAGNOSIS — N1831 Chronic kidney disease, stage 3a: Secondary | ICD-10-CM

## 2019-05-04 DIAGNOSIS — I10 Essential (primary) hypertension: Secondary | ICD-10-CM

## 2019-05-04 DIAGNOSIS — J449 Chronic obstructive pulmonary disease, unspecified: Secondary | ICD-10-CM | POA: Insufficient documentation

## 2019-05-04 DIAGNOSIS — I2510 Atherosclerotic heart disease of native coronary artery without angina pectoris: Secondary | ICD-10-CM | POA: Diagnosis not present

## 2019-05-04 DIAGNOSIS — I4891 Unspecified atrial fibrillation: Secondary | ICD-10-CM | POA: Diagnosis not present

## 2019-05-04 DIAGNOSIS — I472 Ventricular tachycardia: Secondary | ICD-10-CM | POA: Diagnosis not present

## 2019-05-04 DIAGNOSIS — E119 Type 2 diabetes mellitus without complications: Secondary | ICD-10-CM | POA: Diagnosis not present

## 2019-05-04 DIAGNOSIS — Z7984 Long term (current) use of oral hypoglycemic drugs: Secondary | ICD-10-CM | POA: Diagnosis not present

## 2019-05-04 DIAGNOSIS — I5022 Chronic systolic (congestive) heart failure: Secondary | ICD-10-CM | POA: Diagnosis not present

## 2019-05-04 LAB — GLUCOSE, CAPILLARY: Glucose-Capillary: 125 mg/dL — ABNORMAL HIGH (ref 70–99)

## 2019-05-04 NOTE — Patient Instructions (Signed)
Continue weighing daily and call for an overnight weight gain of > 2 pounds or a weekly weight gain of >5 pounds. 

## 2019-06-30 ENCOUNTER — Other Ambulatory Visit: Payer: Self-pay

## 2019-06-30 ENCOUNTER — Emergency Department: Payer: No Typology Code available for payment source

## 2019-06-30 ENCOUNTER — Inpatient Hospital Stay
Admission: EM | Admit: 2019-06-30 | Discharge: 2019-07-03 | DRG: 177 | Disposition: A | Discharge status: Home / Self Care | Payer: No Typology Code available for payment source | Attending: Internal Medicine | Admitting: Internal Medicine

## 2019-06-30 DIAGNOSIS — J1282 Pneumonia due to coronavirus disease 2019: Secondary | ICD-10-CM | POA: Diagnosis present

## 2019-06-30 DIAGNOSIS — E1122 Type 2 diabetes mellitus with diabetic chronic kidney disease: Secondary | ICD-10-CM | POA: Diagnosis present

## 2019-06-30 DIAGNOSIS — Z7901 Long term (current) use of anticoagulants: Secondary | ICD-10-CM | POA: Diagnosis not present

## 2019-06-30 DIAGNOSIS — R0902 Hypoxemia: Secondary | ICD-10-CM | POA: Diagnosis not present

## 2019-06-30 DIAGNOSIS — E785 Hyperlipidemia, unspecified: Secondary | ICD-10-CM | POA: Diagnosis present

## 2019-06-30 DIAGNOSIS — R0602 Shortness of breath: Secondary | ICD-10-CM

## 2019-06-30 DIAGNOSIS — J9601 Acute respiratory failure with hypoxia: Secondary | ICD-10-CM | POA: Diagnosis present

## 2019-06-30 DIAGNOSIS — I10 Essential (primary) hypertension: Secondary | ICD-10-CM | POA: Diagnosis not present

## 2019-06-30 DIAGNOSIS — U071 COVID-19: Secondary | ICD-10-CM | POA: Diagnosis present

## 2019-06-30 DIAGNOSIS — I130 Hypertensive heart and chronic kidney disease with heart failure and stage 1 through stage 4 chronic kidney disease, or unspecified chronic kidney disease: Secondary | ICD-10-CM | POA: Diagnosis present

## 2019-06-30 DIAGNOSIS — R001 Bradycardia, unspecified: Secondary | ICD-10-CM | POA: Diagnosis not present

## 2019-06-30 DIAGNOSIS — Z7984 Long term (current) use of oral hypoglycemic drugs: Secondary | ICD-10-CM

## 2019-06-30 DIAGNOSIS — Z23 Encounter for immunization: Secondary | ICD-10-CM | POA: Diagnosis not present

## 2019-06-30 DIAGNOSIS — R042 Hemoptysis: Secondary | ICD-10-CM | POA: Diagnosis present

## 2019-06-30 DIAGNOSIS — G4733 Obstructive sleep apnea (adult) (pediatric): Secondary | ICD-10-CM | POA: Diagnosis present

## 2019-06-30 DIAGNOSIS — J449 Chronic obstructive pulmonary disease, unspecified: Secondary | ICD-10-CM | POA: Diagnosis present

## 2019-06-30 DIAGNOSIS — I5022 Chronic systolic (congestive) heart failure: Secondary | ICD-10-CM | POA: Diagnosis not present

## 2019-06-30 DIAGNOSIS — Z8249 Family history of ischemic heart disease and other diseases of the circulatory system: Secondary | ICD-10-CM | POA: Diagnosis not present

## 2019-06-30 DIAGNOSIS — I472 Ventricular tachycardia: Secondary | ICD-10-CM | POA: Diagnosis not present

## 2019-06-30 DIAGNOSIS — J189 Pneumonia, unspecified organism: Secondary | ICD-10-CM

## 2019-06-30 DIAGNOSIS — R0609 Other forms of dyspnea: Secondary | ICD-10-CM

## 2019-06-30 DIAGNOSIS — J181 Lobar pneumonia, unspecified organism: Secondary | ICD-10-CM | POA: Diagnosis present

## 2019-06-30 DIAGNOSIS — E1129 Type 2 diabetes mellitus with other diabetic kidney complication: Secondary | ICD-10-CM | POA: Diagnosis present

## 2019-06-30 DIAGNOSIS — N1831 Chronic kidney disease, stage 3a: Secondary | ICD-10-CM

## 2019-06-30 DIAGNOSIS — I2510 Atherosclerotic heart disease of native coronary artery without angina pectoris: Secondary | ICD-10-CM | POA: Diagnosis present

## 2019-06-30 DIAGNOSIS — I4891 Unspecified atrial fibrillation: Secondary | ICD-10-CM | POA: Diagnosis not present

## 2019-06-30 DIAGNOSIS — J440 Chronic obstructive pulmonary disease with acute lower respiratory infection: Secondary | ICD-10-CM | POA: Diagnosis present

## 2019-06-30 DIAGNOSIS — J069 Acute upper respiratory infection, unspecified: Secondary | ICD-10-CM | POA: Diagnosis present

## 2019-06-30 DIAGNOSIS — N183 Chronic kidney disease, stage 3 unspecified: Secondary | ICD-10-CM | POA: Diagnosis present

## 2019-06-30 LAB — COMPREHENSIVE METABOLIC PANEL
ALT: 54 U/L — ABNORMAL HIGH (ref 0–44)
AST: 47 U/L — ABNORMAL HIGH (ref 15–41)
Albumin: 4 g/dL (ref 3.5–5.0)
Alkaline Phosphatase: 72 U/L (ref 38–126)
Anion gap: 12 (ref 5–15)
BUN: 26 mg/dL — ABNORMAL HIGH (ref 8–23)
CO2: 24 mmol/L (ref 22–32)
Calcium: 8.5 mg/dL — ABNORMAL LOW (ref 8.9–10.3)
Chloride: 102 mmol/L (ref 98–111)
Creatinine, Ser: 1.82 mg/dL — ABNORMAL HIGH (ref 0.61–1.24)
GFR calc Af Amer: 44 mL/min — ABNORMAL LOW (ref >60)
GFR calc non Af Amer: 38 mL/min — ABNORMAL LOW (ref >60)
Glucose, Bld: 172 mg/dL — ABNORMAL HIGH (ref 70–99)
Potassium: 3.5 mmol/L (ref 3.5–5.1)
Sodium: 138 mmol/L (ref 135–145)
Total Bilirubin: 1.4 mg/dL — ABNORMAL HIGH (ref 0.3–1.2)
Total Protein: 7.3 g/dL (ref 6.5–8.1)

## 2019-06-30 LAB — TROPONIN I (HIGH SENSITIVITY)
Troponin I (High Sensitivity): 33 ng/L — ABNORMAL HIGH (ref <18)
Troponin I (High Sensitivity): 56 ng/L — ABNORMAL HIGH (ref <18)
Troponin I (High Sensitivity): 54 ng/L — ABNORMAL HIGH (ref <18)
Troponin I (High Sensitivity): 48 ng/L — ABNORMAL HIGH (ref <18)
Troponin I (High Sensitivity): 42 ng/L — ABNORMAL HIGH (ref <18)

## 2019-06-30 LAB — BLOOD GAS, VENOUS
Acid-Base Excess: 1.8 mmol/L (ref 0.0–2.0)
Bicarbonate: 25.7 mmol/L (ref 20.0–28.0)
O2 Saturation: 76.5 %
Patient temperature: 37
pCO2, Ven: 37 mmHg — ABNORMAL LOW (ref 44.0–60.0)
pH, Ven: 7.45 — ABNORMAL HIGH (ref 7.250–7.430)
pO2, Ven: 39 mmHg (ref 32.0–45.0)

## 2019-06-30 LAB — PROTIME-INR
INR: 1.3 — ABNORMAL HIGH (ref 0.8–1.2)
Prothrombin Time: 15.8 seconds — ABNORMAL HIGH (ref 11.4–15.2)

## 2019-06-30 LAB — CBC
HCT: 37.8 % — ABNORMAL LOW (ref 39.0–52.0)
Hemoglobin: 12.6 g/dL — ABNORMAL LOW (ref 13.0–17.0)
MCH: 28.1 pg (ref 26.0–34.0)
MCHC: 33.3 g/dL (ref 30.0–36.0)
MCV: 84.2 fL (ref 80.0–100.0)
Platelets: 170 10*3/uL (ref 150–400)
RBC: 4.49 MIL/uL (ref 4.22–5.81)
RDW: 14.7 % (ref 11.5–15.5)
WBC: 5.5 10*3/uL (ref 4.0–10.5)
nRBC: 0 % (ref 0.0–0.2)

## 2019-06-30 LAB — GLUCOSE, CAPILLARY
Glucose-Capillary: 160 mg/dL — ABNORMAL HIGH (ref 70–99)
Glucose-Capillary: 158 mg/dL — ABNORMAL HIGH (ref 70–99)

## 2019-06-30 LAB — RESPIRATORY PANEL BY RT PCR (FLU A&B, COVID)
Influenza A by PCR: NEGATIVE
Influenza B by PCR: NEGATIVE
SARS Coronavirus 2 by RT PCR: POSITIVE — AB

## 2019-06-30 LAB — TRIGLYCERIDES: Triglycerides: 52 mg/dL (ref <150)

## 2019-06-30 LAB — C-REACTIVE PROTEIN
CRP: 6.3 mg/dL — ABNORMAL HIGH (ref <1.0)
CRP: 7.5 mg/dL — ABNORMAL HIGH (ref <1.0)

## 2019-06-30 LAB — FIBRINOGEN: Fibrinogen: 557 mg/dL — ABNORMAL HIGH (ref 210–475)

## 2019-06-30 LAB — HEPATITIS B SURFACE ANTIGEN: Hepatitis B Surface Ag: NONREACTIVE

## 2019-06-30 LAB — FIBRIN DERIVATIVES D-DIMER (ARMC ONLY): Fibrin derivatives D-dimer (ARMC): 561.48 ng/mL (FEU) — ABNORMAL HIGH (ref 0.00–499.00)

## 2019-06-30 LAB — PROCALCITONIN: Procalcitonin: 0.15 ng/mL

## 2019-06-30 LAB — FERRITIN: Ferritin: 274 ng/mL (ref 24–336)

## 2019-06-30 LAB — BRAIN NATRIURETIC PEPTIDE: B Natriuretic Peptide: 1150 pg/mL — ABNORMAL HIGH (ref 0.0–100.0)

## 2019-06-30 LAB — LACTATE DEHYDROGENASE: LDH: 197 U/L — ABNORMAL HIGH (ref 98–192)

## 2019-06-30 MED ORDER — CARVEDILOL 25 MG PO TABS
25.0000 mg | ORAL_TABLET | Freq: Two times a day (BID) | ORAL | Status: DC
  Administered 2019-06-30 – 2019-07-03 (×4): 25 mg via ORAL
  Filled 2019-06-30 (×4): qty 1

## 2019-06-30 MED ORDER — APIXABAN 5 MG PO TABS: 5.0000 mg | ORAL_TABLET | Freq: Two times a day (BID) | ORAL | Status: DC

## 2019-06-30 MED ORDER — SODIUM CHLORIDE 0.9 % IV SOLN
200.0000 mg | Freq: Once | INTRAVENOUS | Status: AC
  Administered 2019-06-30: 200 mg via INTRAVENOUS
  Filled 2019-06-30: qty 200

## 2019-06-30 MED ORDER — ALBUTEROL SULFATE HFA 108 (90 BASE) MCG/ACT IN AERS
2.0000 | INHALATION_SPRAY | RESPIRATORY_TRACT | Status: DC | PRN
  Administered 2019-07-01 – 2019-07-02 (×2): 2 via RESPIRATORY_TRACT
  Filled 2019-06-30: qty 6.7

## 2019-06-30 MED ORDER — FUROSEMIDE 20 MG PO TABS
20.0000 mg | ORAL_TABLET | Freq: Every day | ORAL | Status: DC
  Administered 2019-06-30 – 2019-07-03 (×4): 20 mg via ORAL
  Filled 2019-06-30 (×3): qty 1

## 2019-06-30 MED ORDER — SODIUM CHLORIDE 0.9 % IV SOLN
500.0000 mg | Freq: Once | INTRAVENOUS | Status: AC
  Administered 2019-06-30: 11:00:00 500 mg via INTRAVENOUS
  Filled 2019-06-30: qty 500

## 2019-06-30 MED ORDER — METHYLPREDNISOLONE SODIUM SUCC 125 MG IJ SOLR
80.0000 mg | Freq: Two times a day (BID) | INTRAMUSCULAR | Status: DC
  Administered 2019-06-30 – 2019-07-02 (×5): 80 mg via INTRAVENOUS
  Filled 2019-06-30 (×5): qty 2

## 2019-06-30 MED ORDER — IPRATROPIUM BROMIDE HFA 17 MCG/ACT IN AERS
2.0000 | INHALATION_SPRAY | RESPIRATORY_TRACT | Status: DC
  Administered 2019-06-30 – 2019-07-03 (×16): 2 via RESPIRATORY_TRACT
  Filled 2019-06-30: qty 12.9

## 2019-06-30 MED ORDER — ZOLPIDEM TARTRATE 5 MG PO TABS
5.0000 mg | ORAL_TABLET | Freq: Every evening | ORAL | Status: DC | PRN
  Administered 2019-06-30: 21:00:00 5 mg via ORAL
  Filled 2019-06-30: qty 1

## 2019-06-30 MED ORDER — INSULIN ASPART 100 UNIT/ML ~~LOC~~ SOLN
0.0000 [IU] | Freq: Every day | SUBCUTANEOUS | Status: DC
  Administered 2019-07-01 – 2019-07-02 (×2): 2 [IU] via SUBCUTANEOUS
  Filled 2019-06-30 (×2): qty 1

## 2019-06-30 MED ORDER — SPIRONOLACTONE 25 MG PO TABS
12.5000 mg | ORAL_TABLET | Freq: Every day | ORAL | Status: DC
  Administered 2019-06-30 – 2019-07-03 (×4): 12.5 mg via ORAL
  Filled 2019-06-30: qty 1
  Filled 2019-06-30 (×2): qty 0.5
  Filled 2019-06-30 (×2): qty 1
  Filled 2019-06-30 (×2): qty 0.5

## 2019-06-30 MED ORDER — SODIUM CHLORIDE 0.9 % IV SOLN
100.0000 mg | Freq: Every day | INTRAVENOUS | Status: DC
  Administered 2019-07-01 – 2019-07-03 (×3): 100 mg via INTRAVENOUS
  Filled 2019-06-30 (×3): qty 20

## 2019-06-30 MED ORDER — ASCORBIC ACID 500 MG PO TABS
500.0000 mg | ORAL_TABLET | Freq: Every day | ORAL | Status: DC
  Administered 2019-06-30 – 2019-07-03 (×4): 500 mg via ORAL
  Filled 2019-06-30 (×3): qty 1

## 2019-06-30 MED ORDER — AMIODARONE HCL 200 MG PO TABS
400.0000 mg | ORAL_TABLET | Freq: Every day | ORAL | Status: DC
  Administered 2019-06-30 – 2019-07-03 (×4): 400 mg via ORAL
  Filled 2019-06-30 (×4): qty 2

## 2019-06-30 MED ORDER — HYDRALAZINE HCL 50 MG PO TABS
50.0000 mg | ORAL_TABLET | Freq: Two times a day (BID) | ORAL | Status: DC
  Administered 2019-06-30 – 2019-07-03 (×6): 50 mg via ORAL
  Filled 2019-06-30 (×6): qty 1

## 2019-06-30 MED ORDER — ALLOPURINOL 100 MG PO TABS
100.0000 mg | ORAL_TABLET | Freq: Two times a day (BID) | ORAL | Status: DC
  Administered 2019-06-30 – 2019-07-03 (×6): 100 mg via ORAL
  Filled 2019-06-30 (×6): qty 1

## 2019-06-30 MED ORDER — NITROGLYCERIN 0.4 MG SL SUBL: 0.4000 mg | SUBLINGUAL_TABLET | SUBLINGUAL | Status: DC | PRN

## 2019-06-30 MED ORDER — HYDRALAZINE HCL 50 MG PO TABS
25.0000 mg | ORAL_TABLET | Freq: Three times a day (TID) | ORAL | Status: DC | PRN
  Administered 2019-06-30 – 2019-07-01 (×2): 25 mg via ORAL
  Filled 2019-06-30 (×2): qty 1

## 2019-06-30 MED ORDER — SACUBITRIL-VALSARTAN 97-103 MG PO TABS
0.5000 | ORAL_TABLET | Freq: Two times a day (BID) | ORAL | Status: DC
  Administered 2019-06-30 – 2019-07-03 (×6): 0.5 via ORAL
  Filled 2019-06-30 (×7): qty 0.5

## 2019-06-30 MED ORDER — SODIUM CHLORIDE 0.9 % IV SOLN
1.0000 g | Freq: Once | INTRAVENOUS | Status: AC
  Administered 2019-06-30: 1 g via INTRAVENOUS
  Filled 2019-06-30: qty 10

## 2019-06-30 MED ORDER — DEXAMETHASONE SODIUM PHOSPHATE 10 MG/ML IJ SOLN
8.0000 mg | Freq: Once | INTRAMUSCULAR | Status: AC
  Administered 2019-06-30: 8 mg via INTRAVENOUS
  Filled 2019-06-30: qty 1

## 2019-06-30 MED ORDER — PNEUMOCOCCAL VAC POLYVALENT 25 MCG/0.5ML IJ INJ
0.5000 mL | INJECTION | INTRAMUSCULAR | Status: AC
  Administered 2019-07-03: 0.5 mL via INTRAMUSCULAR
  Filled 2019-06-30: qty 0.5

## 2019-06-30 MED ORDER — ACETAMINOPHEN 325 MG PO TABS: 650.0000 mg | ORAL_TABLET | Freq: Four times a day (QID) | ORAL | Status: DC | PRN

## 2019-06-30 MED ORDER — ZINC SULFATE 220 (50 ZN) MG PO CAPS
220.0000 mg | ORAL_CAPSULE | Freq: Every day | ORAL | Status: DC
  Administered 2019-06-30 – 2019-07-03 (×4): 220 mg via ORAL
  Filled 2019-06-30 (×3): qty 1

## 2019-06-30 MED ORDER — ATORVASTATIN CALCIUM 20 MG PO TABS
20.0000 mg | ORAL_TABLET | Freq: Every day | ORAL | Status: DC
  Administered 2019-06-30 – 2019-07-02 (×3): 20 mg via ORAL
  Filled 2019-06-30 (×3): qty 1

## 2019-06-30 MED ORDER — INFLUENZA VAC SPLIT QUAD 0.5 ML IM SUSY
0.5000 mL | PREFILLED_SYRINGE | INTRAMUSCULAR | Status: DC
  Filled 2019-06-30: qty 0.5

## 2019-06-30 MED ORDER — DM-GUAIFENESIN ER 30-600 MG PO TB12
1.0000 | ORAL_TABLET | Freq: Two times a day (BID) | ORAL | Status: DC
  Administered 2019-06-30 – 2019-07-03 (×7): 1 via ORAL
  Filled 2019-06-30 (×8): qty 1

## 2019-06-30 MED ORDER — INSULIN ASPART 100 UNIT/ML ~~LOC~~ SOLN
0.0000 [IU] | Freq: Three times a day (TID) | SUBCUTANEOUS | Status: DC
  Administered 2019-06-30: 2 [IU] via SUBCUTANEOUS
  Administered 2019-07-01 (×3): 3 [IU] via SUBCUTANEOUS
  Administered 2019-07-02: 5 [IU] via SUBCUTANEOUS
  Administered 2019-07-02 (×2): 3 [IU] via SUBCUTANEOUS
  Administered 2019-07-03 (×2): 5 [IU] via SUBCUTANEOUS
  Filled 2019-06-30 (×9): qty 1

## 2019-06-30 NOTE — Progress Notes (Signed)
Katie with Lab called and said that patients Troponin was elevated to 56. Made Dr. Clyde Lundborg aware of same, no new orders at this time.

## 2019-06-30 NOTE — Progress Notes (Signed)
Remdesivir - Pharmacy Brief Note   O:  ALT: 54 CXR: Right middle lobe airspace consolidation consistent with pneumonia SpO2: 92-96% on 2LNC   A/P:  Remdesivir 200 mg IVPB once followed by 100 mg IVPB daily x 4 days.   Laureen Ochs, PharmD 06/30/2019 3:07 PM

## 2019-06-30 NOTE — ED Notes (Signed)
Report given to Amber, RN 

## 2019-06-30 NOTE — H&P (Addendum)
History and Physical    John Powell:[id number] DOB: Dec 03, 1955 DOA: 06/30/2019  Referring MD/NP/PA:   [facility]P: [facility] [place]   Patient coming from:  The patient is coming from home.  At baseline, pt is independent for most of ADL.        Chief Complaint: SOB  HPI: John Powell is a 64 y.o. male with medical history significant of hypertension, hyperlipidemia, diabetes mellitus, COPD, cough, OSA, CAD, sCHF with EF 20-25%, atrial fibrillation on Eliquis, CKD stage IIIa, who presents with shortness breath.  Patient states that he has been having shortness of breath for more than 1 week, which has been progressively worsening.  He has some dry cough, but no chest pain.  No fever or chills.  Denies nausea, vomiting, diarrhea, abdominal pain, symptoms of UTI or unilateral weakness. Per report, patient initially could not speak in full sentences and needs 2L nasal cannula oxygen to maintain oxygen saturation between 93 to 97%.  ED Course: pt was found to have positive RVP for COVID-19, slightly worsening renal function, troponin 33, INR 1.3, BNP 1150, WBC 5.5, temperature 99.3, blood pressure 151/98, heart rate 97, RR 23.  Chest x-ray showed right middle lobe infiltration, cardiomegaly and vascular congestion.  Patient is admitted to [place] bed as inpatient.  Review of Systems:   General: no fevers, chills, no body weight gain, has fatigues HEENT: no blurry vision, hearing changes or sore throat Respiratory: has dyspnea, coughing, no wheezing CV: no chest pain, no palpitations GI: no nausea, vomiting, abdominal pain, diarrhea, constipation GU: no dysuria, burning on urination, increased urinary frequency, hematuria  Ext: has trace leg edema bilaterally. Neuro: no unilateral weakness, numbness, or tingling, no vision change or hearing loss Skin: no rash, no skin tear. MSK: No muscle spasm, no deformity, no limitation of range of movement in spin Heme: No easy bruising.   Travel history: No recent long distant travel.  Allergy: No Known Allergies  Past Medical History:  Diagnosis Date  . CHF (congestive heart failure) (John Powell)   . COPD (chronic obstructive pulmonary disease) ([place])   . Coronary artery disease   . Diabetes mellitus without complication (John Powell)   . Hypertension   . Sleep apnea     Past Surgical History:  Procedure Laterality Date  . CHOLECYSTECTOMY      Social History:  reports that he has never smoked. He has never used smokeless tobacco. He reports previous alcohol use. He reports previous drug use.  Family History:  Family History  Problem Relation Age of Onset  . Hypertension Mother      Prior to Admission medications   Medication Sig Start Date End Date Taking? Authorizing Provider  allopurinol (ZYLOPRIM) 100 MG tablet Take 100 mg by mouth 2 (two) times daily.    [provider]  amiodarone (PACERONE) 400 MG tablet Take 1 tablet (400 mg total) by mouth daily. 03/18/19   John Basta, MD  apixaban (ELIQUIS) 5 MG TABS tablet Take 1 tablet (5 mg total) by mouth 2 (two) times daily. 03/17/19   John Basta, MD  atorvastatin (LIPITOR) 40 MG tablet Take 20 mg by mouth at bedtime.    [provider]  B Complex-C (SUPER B COMPLEX PO) Take 1 tablet by mouth daily.    [provider]  carvedilol (COREG) 12.5 MG tablet Take 1 tablet (12.5 mg total) by mouth 2 (two) times daily with a meal. 03/17/19   John Basta, MD  furosemide (LASIX) 20 MG tablet  Take 1 tablet (20 mg total) by mouth daily. 03/18/19   John Dilling, MD  glipiZIDE (GLUCOTROL) 5 MG tablet Take 5 mg by mouth 2 (two) times daily.    [provider]  hydrALAZINE (APRESOLINE) 50 MG tablet Take 1 tablet (50 mg total) by mouth 2 (two) times daily. 03/17/19   John Dilling, MD  isosorbide mononitrate (IMDUR) 60 MG 24 hr tablet Take 30 mg by mouth 2 (two) times daily.    [provider]   metFORMIN (GLUCOPHAGE) 1000 MG tablet Take 1,000 mg by mouth 2 (two) times daily with a meal.    [provider]  nitroGLYCERIN (NITROSTAT) 0.4 MG SL tablet Place 1 tablet (0.4 mg total) under the tongue every 5 (five) minutes as needed for chest pain. 03/17/19   John Dilling, MD  potassium chloride (KLOR-CON) 20 MEQ packet Take 20 mEq by mouth 2 (two) times daily.    [provider]  sacubitril-valsartan (ENTRESTO) 24-26 MG Take 1 tablet by mouth 2 (two) times daily. 03/17/19   John Dilling, MD  spironolactone (ALDACTONE) 25 MG tablet Take 0.5 tablets (12.5 mg total) by mouth daily. 03/18/19   John Dilling, MD    Physical Exam: Vitals:   06/30/19 0900 06/30/19 0930 06/30/19 1309 06/30/19 1345  BP:   (!) 162/103 (!) 155/96  Pulse: 72     Resp: 16 20    Temp:      SpO2: 97% 93%    Weight:      Height:       General: Not in acute distress HEENT:       Eyes: PERRL, EOMI, no scleral icterus.       ENT: No discharge from the ears and nose, no pharynx injection, no tonsillar enlargement.        Neck: No JVD, no bruit, no mass felt. Heme: No neck lymph node enlargement. Cardiac: S1/S2, RRR, No murmurs, No gallops or rubs. Respiratory: No rales, wheezing, rhonchi or rubs. GI: Soft, nondistended, nontender, no rebound pain, no organomegaly, BS present. GU: No hematuria Ext: has trace leg edema bilaterally. 2+DP/PT pulse bilaterally. Musculoskeletal: No joint deformities, No joint redness or warmth, no limitation of ROM in spin. Skin: No rashes.  Neuro: Alert, oriented X3, cranial nerves II-XII grossly intact, moves all extremities normally. Psych: Patient is not psychotic, no suicidal or hemocidal ideation.  Labs on Admission: I have personally reviewed following labs and imaging studies  CBC: Recent Labs  Lab 06/30/19 0704  WBC 5.5  HGB 12.6*  HCT 37.8*  MCV 84.2  PLT 170   Basic Metabolic Panel: Recent Labs  Lab 06/30/19 0704   NA 138  K 3.5  CL 102  CO2 24  GLUCOSE 172*  BUN 26*  CREATININE 1.82*  CALCIUM 8.5*   GFR: Estimated Creatinine Clearance: 62.2 mL/min (A) (by C-G formula based on SCr of 1.82 mg/dL (H)). Liver Function Tests: Recent Labs  Lab 06/30/19 0704  AST 47*  ALT 54*  ALKPHOS 72  BILITOT 1.4*  PROT 7.3  ALBUMIN 4.0   No results for input(s): LIPASE, AMYLASE in the last 168 hours. No results for input(s): AMMONIA in the last 168 hours. Coagulation Profile: Recent Labs  Lab 06/30/19 0704  INR 1.3*   Cardiac Enzymes: No results for input(s): CKTOTAL, CKMB, CKMBINDEX, TROPONINI in the last 168 hours. BNP (last 3 results) No results for input(s): PROBNP in the last 8760 hours. HbA1C: No results for input(s): HGBA1C in the last 72 hours. CBG:  No results for input(s): GLUCAP in the last 168 hours. Lipid Profile: Recent Labs    06/30/19 1451  TRIG 52   Thyroid Function Tests: No results for input(s): TSH, T4TOTAL, FREET4, T3FREE, THYROIDAB in the last 72 hours. Anemia Panel: Recent Labs    06/30/19 1451  FERRITIN 274   Urine analysis:    Component Value Date/Time   COLORURINE Yellow 08/31/2012 1722   APPEARANCEUR Clear 08/31/2012 1722   LABSPEC 1.028 08/31/2012 1722   PHURINE 5.0 08/31/2012 1722   GLUCOSEU >=500 08/31/2012 1722   HGBUR Negative 08/31/2012 1722   BILIRUBINUR Negative 08/31/2012 1722   KETONESUR 1+ 08/31/2012 1722   PROTEINUR Negative 08/31/2012 1722   NITRITE Negative 08/31/2012 1722   LEUKOCYTESUR Negative 08/31/2012 1722   Sepsis Labs: @LABRCNTIP (procalcitonin:4,lacticidven:4) ) Recent Results (from the past 240 hour(s))  Respiratory Panel by RT PCR (Flu A&B, Covid) - Nasopharyngeal Swab     Status: Abnormal   Collection Time: 06/30/19  8:00 AM   Specimen: Nasopharyngeal Swab  Result Value Ref Range Status   SARS Coronavirus 2 by RT PCR POSITIVE (A) NEGATIVE Final    Comment: RESULT CALLED TO, READ BACK BY AND VERIFIED WITH:  07/02/19 AT [place] November 06/30/19 SDR (NOTE) SARS-CoV-2 target nucleic acids are DETECTED. SARS-CoV-2 RNA is generally detectable in upper respiratory specimens  during the acute phase of infection. Positive results are indicative of the presence of the identified virus, but do not rule out bacterial infection or co-infection with other pathogens not detected by the test. Clinical correlation with patient history and other diagnostic information is necessary to determine patient infection status. The expected result is Negative. Fact Sheet for Patients:  07/02/19 Fact Sheet for Healthcare Providers: https://www.moore.com/ This test is not yet approved or cleared by the https://www.young.biz/ FDA and  has been authorized for detection and/or diagnosis of SARS-CoV-2 by FDA under an Emergency Use Authorization (EUA).  This EUA will remain in effect (meaning this test can be used) for  the duration of  the COVID-19 declaration under Section 564(b)(1) of the Act, 21 U.S.C. section 360bbb-3(b)(1), unless the authorization is terminated or revoked sooner.    Influenza A by PCR NEGATIVE NEGATIVE Final   Influenza B by PCR NEGATIVE NEGATIVE Final    Comment: (NOTE) The Xpert Xpress SARS-CoV-2/FLU/RSV assay is intended as an aid in  the diagnosis of influenza from Nasopharyngeal swab specimens and  should not be used as a sole basis for treatment. Nasal washings and  aspirates are unacceptable for Xpert Xpress SARS-CoV-2/FLU/RSV  testing. Fact Sheet for Patients: [place] Fact Sheet for Healthcare Providers: https://www.moore.com/ This test is not yet approved or cleared by the https://www.young.biz/ FDA and  has been authorized for detection and/or diagnosis of SARS-CoV-2 by  FDA under an Emergency Use Authorization (EUA). This EUA will remain  in effect (meaning this test can be used) for the duration of the   Covid-19 declaration under Section 564(b)(1) of the Act, 21  U.S.C. section 360bbb-3(b)(1), unless the authorization is  terminated or revoked. Performed at [facility], [address] [place], [place]      Radiological Exams on Admission: DG Chest 2 View  Result Date: 06/30/2019 CLINICAL DATA:  Shortness of breath EXAM: CHEST - 2 VIEW COMPARISON:  March 13, 2019 FINDINGS: There is airspace consolidation throughout much of the right middle lobe. There is atelectatic change in the left lower lung region. There is cardiomegaly with pulmonary venous hypertension. No adenopathy. There is  aortic atherosclerosis. No bone lesions. IMPRESSION: Right middle lobe airspace consolidation consistent with pneumonia. Atelectatic change left lower lung region. Cardiomegaly with pulmonary vascular congestion noted. No interstitial edema appreciable. Aortic Atherosclerosis (ICD10-I70.0). Electronically Signed   By: Bretta BangWilliam  Woodruff III M.D.   On: 06/30/2019 08:16     EKG: Independently reviewed.  QTc 528, PVC, LAE, poor R wave progression  Assessment/Plan Principal Problem:   Acute respiratory disease due to COVID-19 virus Active Problems:   COPD (chronic obstructive pulmonary disease) (HCC)   Coronary artery disease   Hypertension   Chronic systolic CHF (congestive heart failure) (HCC)   CKD (chronic kidney disease), stage IIIa   Type II diabetes mellitus with renal manifestations (HCC)   Acute respiratory disease due to COVID-19 virus: Currently patient has oxygen saturation 93 to 97% on 2 L nasal cannula oxygen.  Chest x-ray showed right middle lobe infiltration. Pt received 1 dose of Rocephin and azithromycin in ED, will not continue antibiotics.  -will admit to med-surg as inpt -Remdesivir per pharm -Solumedrol 80 mg bid -vitamin C, zinc.   -Bronchodilators -PRN Mucinex for cough -f/u Blood culture -Gentle IV fluid:  -D-dimer, BNP,Trop, LFT, CRP, LDH,  Procalcitonin, Ferritin, fibinogen, TG, Hep B SAg, HIV ab -Daily CRP, Ferritin, D-dimer, -Will ask the patient to maintain an awake prone position for 16+ hours a day, if possible, with a minimum of 2-3 hours at a time -Will attempt to maintain euvolemia to a net negative fluid status -IF patient deteriorates, will consult PCCM and ID  Addendum: per RN report, pt had hemoptysis after arrival to the floor. -will hold off Eliquis  A fib: -continue amiodarone, Coreg -Hold Eliquis  COPD (chronic obstructive pulmonary disease) (HCC): -Bronchodilators as above  Coronary artery disease and elevated trop: trop is minimally elevated at 33.  No chest pain.  Most likely due to COVID-19 infection. -Continue Lipitor, as needed nitroglycerin, and Coreg -will trend trop  Essential hypertension: -prn Hydralazine -Continue home medications: Coreg, hydralazine, Entresto -Patient is also on Lasix and spironolactone for CHF  Chronic systolic CHF (congestive heart failure) (HCC): pt has trace leg edema. BNP 1150. Has VC on CXR, but no obvious pulmonary edema.  Patient at the risk of developing CHF exacerbation. -Continue home Lasix and spironolactone -Continue Entresto  CKD (chronic kidney disease), stage IIIa: Baseline creatinine 1.3-1.6.  His creatinine is 1.82, BUN 26, slightly worsening than the baseline. Since pt is at risk of developing CHF exacerbation, will not hold diuretics and Entresto at this moment. -Monitor renal function closely by BMP  Type II diabetes mellitus with renal manifestations (HCC): Last A1c 5.8 on 03/13/19, well controled. Patient is taking Metformin and glipizide at home -SSI   Inpatient status:  # Patient requires inpatient status due to high intensity of service, high risk for further deterioration and high frequency of surveillance required.  I certify that at the point of admission it is my clinical judgment that the patient will require inpatient hospital care  spanning beyond 2 midnights from the point of admission.  . This patient has multiple chronic comorbidities including hypertension, hyperlipidemia, diabetes mellitus, COPD, cough, OSA, CAD, CHF with EF 20-25%, atrial fibrillation on Eliquis, CKD stage IIIa . Now patient has presenting with acute respiratory disease due to COVID-19 virus . The worrisome physical exam findings include bilateral leg edema . The initial radiographic and laboratory data are worrisome because of elevated BNP, positive RVP for COVID-19 infection, worsening renal function . Current medical needs: please see my  assessment and plan Predictability of an adverse outcome (risk): Patient has multiple comorbidities as listed above. Now presents with acute respiratory disease due to COVID-19 virus with new oxygen requirement. Patient's presentation is highly complicated.  Patient is at high risk of deteriorating.  Will need to be treated in hospital for at least 2 days.      DVT ppx: on Eliquis -->changed to SCD Code Status: Full code Family Communication: None at bed side.   Disposition Plan:  Anticipate discharge back to previous home environment Consults called:  none Admission status: Med-surg bed as inpt       Date of Service 06/30/2019    Lorretta Harp [facility]   If 7PM-7AM, please contact night-coverage [email or web address] Password [facility] 06/30/2019, 4:21 PM

## 2019-06-30 NOTE — Progress Notes (Signed)
Ch spoke with Pt over the phone. When Ch asked Pt how he was doing, Pt responded with "Like someone ran over me with a truck" Pt was very communicative of situation. When asked if Pt needed any prayer, Pt said that he has a pastoral friend who checks up on him regularly. Ch let the Pt know that Chaplains are available anytime for spiritual, or emotional support.   06/30/19 1507  Clinical Encounter Type  Visited With Patient  Visit Type Initial  Referral From Other (Comment)  Consult/Referral To None  Spiritual Encounters  Spiritual Needs Emotional  Stress Factors  Patient Stress Factors Health changes

## 2019-06-30 NOTE — Care Management Note (Signed)
Case Management Note  Patient Details  Name: John Powell MRN: [id number] Date of Birth: 1955-11-17  Subjective/Objective:    This RNCM called the [facility] to notify that patient has been admitted to [facility].  [facility] declined transfer from the emergency department.  RNCM spoke with Evaristo Bury in the transfer center, Evaristo Bury reports that the [facility] will follow the patient, this RNCM will update as condition changes or progresses.                 Action/Plan:   Expected Discharge Date:                  Expected Discharge Plan:     In-House Referral:     Discharge planning Services     Post Acute Care Choice:    Choice offered to:     DME Arranged:    DME Agency:     HH Arranged:    HH Agency:     Status of Service:     If discussed at [employer] of Stay Meetings, dates discussed:    Additional Comments:  Allayne Butcher, RN 06/30/2019, 2:55 PM

## 2019-06-30 NOTE — Progress Notes (Signed)
Spoke to patients wife, Steward Drone. Gave her an update on patient. All questions were answered during call.

## 2019-06-30 NOTE — ED Triage Notes (Signed)
Patient c/o SOB. Patient reports hx of heart failure. Patient with labored respirations, 3-5 word sentences.

## 2019-06-30 NOTE — Progress Notes (Signed)
The patient called me to the room and wanted to show me tissues that he had coughed into that had bright red blood in them. Made Dr. Clyde Lundborg aware. Order to hold Eliquis. Dr. Clyde Lundborg said he would put order in.

## 2019-06-30 NOTE — ED Notes (Signed)
Patient transported to X-ray 

## 2019-06-30 NOTE — Progress Notes (Signed)
Blood sugar 160

## 2019-06-30 NOTE — ED Provider Notes (Signed)
[facility] Emergency Department Provider Note  ____________________________________________   First MD Initiated Contact with Patient 06/30/19 0701     (approximate)  I have reviewed the triage vital signs and the nursing notes.  History  Chief Complaint Shortness of Breath    HPI John Powell is a 64 y.o. male with hx of HFrEF (20-25%), COPD, CKD, DM, sleep apnea, atrial fibrillation (on Eliquis), who presents to the ED for shortness of breath. Symptoms present x several weeks and have been progressively worsening. Associated with a mild, dry cough. No chest pain. He states he cannot walk more than 40 feet without getting SOB. Denies any fevers or known COVID exposure, but states a few of his family members that he lives with do have some congestion. Placed on 2 L [place] in triage for oxygen in low 90s. Does not normally wear supplemental oxygen.    Past Medical Hx Past Medical History:  Diagnosis Date  . CHF (congestive heart failure) (HCC)   . COPD (chronic obstructive pulmonary disease) (HCC)   . Coronary artery disease   . Diabetes mellitus without complication (HCC)   . Hypertension   . Sleep apnea     Problem List Patient Active Problem List   Diagnosis Date Noted  . Acute CHF (congestive heart failure) (HCC) 03/13/2019  . Acute on chronic diastolic CHF (congestive heart failure) (HCC) 03/13/2019    Past Surgical Hx Past Surgical History:  Procedure Laterality Date  . CHOLECYSTECTOMY      Medications Prior to Admission medications   Medication Sig Start Date End Date Taking? Authorizing Provider  allopurinol (ZYLOPRIM) 100 MG tablet Take 100 mg by mouth 2 (two) times daily.    [provider]  amiodarone (PACERONE) 400 MG tablet Take 1 tablet (400 mg total) by mouth daily. 03/18/19   Altamese Dilling, MD  apixaban (ELIQUIS) 5 MG TABS tablet Take 1 tablet (5 mg total) by mouth 2 (two) times daily. 03/17/19   Altamese Dilling, MD  atorvastatin (LIPITOR) 40 MG tablet Take 20 mg by mouth at bedtime.    [provider]  B Complex-C (SUPER B COMPLEX PO) Take 1 tablet by mouth daily.    [provider]  carvedilol (COREG) 12.5 MG tablet Take 1 tablet (12.5 mg total) by mouth 2 (two) times daily with a meal. 03/17/19   Altamese Dilling, MD  furosemide (LASIX) 20 MG tablet Take 1 tablet (20 mg total) by mouth daily. 03/18/19   Altamese Dilling, MD  glipiZIDE (GLUCOTROL) 5 MG tablet Take 5 mg by mouth 2 (two) times daily.    [provider]  hydrALAZINE (APRESOLINE) 50 MG tablet Take 1 tablet (50 mg total) by mouth 2 (two) times daily. 03/17/19   Altamese Dilling, MD  isosorbide mononitrate (IMDUR) 60 MG 24 hr tablet Take 30 mg by mouth 2 (two) times daily.    [provider]  metFORMIN (GLUCOPHAGE) 1000 MG tablet Take 1,000 mg by mouth 2 (two) times daily with a meal.    [provider]  nitroGLYCERIN (NITROSTAT) 0.4 MG SL tablet Place 1 tablet (0.4 mg total) under the tongue every 5 (five) minutes as needed for chest pain. 03/17/19   Altamese Dilling, MD  potassium chloride (KLOR-CON) 20 MEQ packet Take 20 mEq by mouth 2 (two) times daily.    [provider]  sacubitril-valsartan (ENTRESTO) 24-26 MG Take 1 tablet by mouth 2 (two) times daily. 03/17/19   Altamese Dilling, MD  spironolactone (ALDACTONE)  25 MG tablet Take 0.5 tablets (12.5 mg total) by mouth daily. 03/18/19   Vaughan Basta, MD    Allergies Patient has no known allergies.  Family Hx Family History  Problem Relation Age of Onset  . Hypertension Mother     Social Hx Social History   Tobacco Use  . Smoking status: Never Smoker  . Smokeless tobacco: Never Used  Substance Use Topics  . Alcohol use: Not Currently  . Drug use: Not Currently     Review of Systems  Constitutional: Negative for fever, chills. Eyes: Negative for visual changes. ENT:  Negative for sore throat. Cardiovascular: Negative for chest pain. Respiratory: + for shortness of breath. Gastrointestinal: Negative for nausea, vomiting.  Genitourinary: Negative for dysuria. Musculoskeletal: Negative for leg swelling. Skin: Negative for rash. Neurological: Negative for for headaches.   Physical Exam  Vital Signs: ED Triage Vitals  Enc Vitals Group     BP 06/30/19 0655 (!) 154/110     Pulse Rate 06/30/19 0655 81     Resp 06/30/19 0655 (!) 26     Temp 06/30/19 0655 99.3 F (37.4 C)     Temp src --      SpO2 06/30/19 0655 93 %     Weight 06/30/19 0649 (!) 334 lb (151.5 kg)     Height 06/30/19 0649 6' (1.829 m)     Head Circumference --      Peak Flow --      Pain Score 06/30/19 0649 0     Pain Loc --      Pain Edu? --      Excl. in [place]? --     Constitutional: Alert and oriented.  Head: Normocephalic. Atraumatic. Eyes: Conjunctivae clear. Sclera anicteric. Nose: No congestion. No rhinorrhea. Mouth/Throat: Wearing mask.  Neck: No stridor.   Cardiovascular: Normal rate, regular rhythm. Extremities well perfused. Respiratory:  On 2 L [place]. Appears SOB with long sentences. Decreased BS at right base. Gastrointestinal: Soft. Non-tender. Non-distended.  Musculoskeletal: Trace lower extremity edema. No deformities. Neurologic:  Normal speech and language. No gross focal neurologic deficits are appreciated.  Skin: Skin is warm, dry and intact. No rash noted. Psychiatric: Mood and affect are appropriate for situation.  EKG  Personally reviewed.   Rate: 82 Rhythm: sinus Axis: rightward Intervals: 1st degree AV block, prolonged QTc Minimal ST depression in lateral precordial leads PVC No STEMI    Radiology  CXR: IMPRESSION:  Right middle lobe airspace consolidation consistent with pneumonia.  Atelectatic change left lower lung region.   Cardiomegaly with pulmonary vascular congestion noted. No interstitial edema appreciable. Aortic Atherosclerosis   (ICD10-I70.0).    Procedures  Procedure(s) performed (including critical care):  .Critical Care Performed by: Lilia Pro., MD Authorized by: Lilia Pro., MD   Critical care provider statement:    Critical care time (minutes):  30   Critical care was necessary to treat or prevent imminent or life-threatening deterioration of the following conditions:  Respiratory failure   Critical care was time spent personally by me on the following activities:  Discussions with consultants, evaluation of patient's response to treatment, examination of patient, ordering and performing treatments and interventions, ordering and review of laboratory studies, ordering and review of radiographic studies, pulse oximetry, re-evaluation of patient's condition, obtaining history from patient or surrogate and review of old charts     Initial Impression / Assessment and Plan / ED Course  64 y.o. male who presents to the ED for SOB, DOE, now requiring  [place] in ED.  Ddx: HF exacerbation, COPD, pulmonary infection, PNA, COVID. Less likely PE given his compliance on anticoagulation.  Will obtain labs, imaging.  CXR with a right middle lobar consolidation, consistent with pneumonia.  Will treat with antibiotics, procalcitonin added on for further differentiation.  COVID swab is positive.  Will order steroids, consult pharmacy for remdesivir, plan for admission. Procalcitonin still pending to help determine if XR findings s/o possible superimposed lobar PNA vs COVID PNA.  [facility] agreeable. Discussed w/ hospitalist for admssion.    Final Clinical Impression(s) / ED Diagnosis  Final diagnoses:  SOB (shortness of breath)  Dyspnea on exertion  Hypoxia       Note:  This document was prepared using Dragon voice recognition software and may include unintentional dictation errors.   Miguel Aschoff., MD 06/30/19 [phone]

## 2019-07-01 DIAGNOSIS — I4891 Unspecified atrial fibrillation: Secondary | ICD-10-CM

## 2019-07-01 LAB — COMPREHENSIVE METABOLIC PANEL
ALT: 62 U/L — ABNORMAL HIGH (ref 0–44)
AST: 56 U/L — ABNORMAL HIGH (ref 15–41)
Albumin: 3.7 g/dL (ref 3.5–5.0)
Alkaline Phosphatase: 70 U/L (ref 38–126)
Anion gap: 11 (ref 5–15)
BUN: 27 mg/dL — ABNORMAL HIGH (ref 8–23)
CO2: 24 mmol/L (ref 22–32)
Calcium: 8.6 mg/dL — ABNORMAL LOW (ref 8.9–10.3)
Chloride: 105 mmol/L (ref 98–111)
Creatinine, Ser: 1.28 mg/dL — ABNORMAL HIGH (ref 0.61–1.24)
GFR calc Af Amer: >60 mL/min (ref >60)
GFR calc non Af Amer: 59 mL/min — ABNORMAL LOW (ref >60)
Glucose, Bld: 198 mg/dL — ABNORMAL HIGH (ref 70–99)
Potassium: 3.7 mmol/L (ref 3.5–5.1)
Sodium: 140 mmol/L (ref 135–145)
Total Bilirubin: 1.5 mg/dL — ABNORMAL HIGH (ref 0.3–1.2)
Total Protein: 7 g/dL (ref 6.5–8.1)

## 2019-07-01 LAB — CBC WITH DIFFERENTIAL/PLATELET
Abs Immature Granulocytes: 0.03 10*3/uL (ref 0.00–0.07)
Basophils Absolute: 0 10*3/uL (ref 0.0–0.1)
Basophils Relative: 0 %
Eosinophils Absolute: 0 10*3/uL (ref 0.0–0.5)
Eosinophils Relative: 0 %
HCT: 38.3 % — ABNORMAL LOW (ref 39.0–52.0)
Hemoglobin: 12.7 g/dL — ABNORMAL LOW (ref 13.0–17.0)
Immature Granulocytes: 1 %
Lymphocytes Relative: 17 %
Lymphs Abs: 0.6 10*3/uL — ABNORMAL LOW (ref 0.7–4.0)
MCH: 27.9 pg (ref 26.0–34.0)
MCHC: 33.2 g/dL (ref 30.0–36.0)
MCV: 84 fL (ref 80.0–100.0)
Monocytes Absolute: 0.2 10*3/uL (ref 0.1–1.0)
Monocytes Relative: 4 %
Neutro Abs: 2.8 10*3/uL (ref 1.7–7.7)
Neutrophils Relative %: 78 %
Platelets: 146 10*3/uL — ABNORMAL LOW (ref 150–400)
RBC: 4.56 MIL/uL (ref 4.22–5.81)
RDW: 14.5 % (ref 11.5–15.5)
WBC: 3.6 10*3/uL — ABNORMAL LOW (ref 4.0–10.5)
nRBC: 0 % (ref 0.0–0.2)

## 2019-07-01 LAB — GLUCOSE, CAPILLARY
Glucose-Capillary: 205 mg/dL — ABNORMAL HIGH (ref 70–99)
Glucose-Capillary: 213 mg/dL — ABNORMAL HIGH (ref 70–99)
Glucose-Capillary: 228 mg/dL — ABNORMAL HIGH (ref 70–99)
Glucose-Capillary: 218 mg/dL — ABNORMAL HIGH (ref 70–99)

## 2019-07-01 LAB — FIBRIN DERIVATIVES D-DIMER (ARMC ONLY): Fibrin derivatives D-dimer (ARMC): 535.88 ng/mL (FEU) — ABNORMAL HIGH (ref 0.00–499.00)

## 2019-07-01 LAB — C-REACTIVE PROTEIN: CRP: 6.7 mg/dL — ABNORMAL HIGH (ref <1.0)

## 2019-07-01 LAB — FERRITIN: Ferritin: 294 ng/mL (ref 24–336)

## 2019-07-01 LAB — MAGNESIUM: Magnesium: 1.9 mg/dL (ref 1.7–2.4)

## 2019-07-01 MED ORDER — MAGNESIUM SULFATE 2 GM/50ML IV SOLN
2.0000 g | Freq: Once | INTRAVENOUS | Status: AC
  Administered 2019-07-01: 08:00:00 2 g via INTRAVENOUS
  Filled 2019-07-01: qty 50

## 2019-07-01 MED ORDER — POTASSIUM CHLORIDE CRYS ER 20 MEQ PO TBCR
40.0000 meq | EXTENDED_RELEASE_TABLET | Freq: Once | ORAL | Status: AC
  Administered 2019-07-01: 09:00:00 40 meq via ORAL
  Filled 2019-07-01: qty 2

## 2019-07-01 NOTE — Plan of Care (Signed)
  Problem: Education: Goal: Knowledge of risk factors and measures for prevention of condition will improve Outcome: Progressing   Problem: Respiratory: Goal: Will maintain a patent airway Outcome: Progressing   Problem: Education: Goal: Knowledge of General Education information will improve Description: Including pain rating scale, medication(s)/side effects and non-pharmacologic comfort measures Outcome: Progressing   Problem: Health Behavior/Discharge Planning: Goal: Ability to manage health-related needs will improve Outcome: Progressing   

## 2019-07-01 NOTE — Progress Notes (Signed)
PROGRESS NOTE    John Powell  OXB:[id number] DOB: 12-27-1955 DOA: 06/30/2019  [facility]: [facility]    LOS - 1   Brief Narrative:  64 y.o. male with medical history of hypertension, hyperlipidemia, diabetes mellitus, COPD, cough, OSA, CAD, sCHF with EF 20-25%, A-fib on Eliquis, CKD stage IIIa, who presented to the ED on 06/30/19 with progressive shortness breath for > 1 week.  Associated dry cough, no fever/chills or GI symptoms.  In the ED, had some conversational dyspnea and hypoxia requiring 2 L/min oxygen, tachypneic, and hypertension 154/110.  Initial labs notable for positive COVID-19 PCR test, slightly worsened renal function from baseline, troponins mildly elevated were trended and flat, BNP 1150, no leukocytosis and elevated inflammatory markers including CRP 6.5, procalcitonin negative.  Chest x-ray showed right middle lobe infiltration, cardiomegaly and vascular congestion.  Patient admitted and started on treatment for COVID-19 pneumonia with Remdesivir, steroids, vitamin C and zinc, bronchodilators and antitussives.  Patient also started on empiric antibiotics for possible community-acquired bacterial pneumonia.  On review of chest x-ray, does appear more typical lobar pneumonia.    Subjective 1/15: Patient seen this AM.  Overnight patient had nonsustained runs of VT on telemetry and bradycardia.  Patient denies having any symptoms including chest pain, dizzy/lightheadedness or palpitations.  He reports feeling a little better today.  He normally uses CPAP at 13 cmH2O at home.  With our machine, he says 13 isn't enough and he asked to increase it.  Gradually increased and he said it felt right at 18 cmH2O.    Assessment & Plan:   Principal Problem:   Acute respiratory disease due to COVID-19 virus Active Problems:   COPD (chronic obstructive pulmonary disease) (HCC)   Chronic systolic CHF (congestive heart failure) (HCC)   Coronary artery disease   Hypertension    CKD (chronic kidney disease), stage IIIa   Type II diabetes mellitus with renal manifestations (HCC)   A-fib (HCC)   Acute hypoxemic respiratory failure due to COVID-19 secondary to Pneumonia due to COVID-19 virus  vs Community-acquired Pneumonia Presented with progressive shortness of breath, hypoxia, chest x-ray showing right middle lobe pneumonia and Covid test positive. --Date of Dx: 06/30/2019 --Oxygen requirements: 2 L/min --Antibiotics: Procalcitonin negative, antibiotics held --Diuretics: History of HFrEF (20-25% on last echo), clinically euvolemic --maintain euvolemia to net-neg fluid balance --Vitamin C and Zinc: Per protocol --Remdesivir: Started on  1/14 --Steroids: Started on  1/14 --maintain airborne and contact precautions --supplemental oxygen, maintain o2 sat > 90% --Atrovent & Ventolin inhalers --Tussionex and Robitussin --encourage patient to maintain awake prone positive for 16+hours a day as possible, minimum 2-3 hours at night --monitor inflammatory markers daily CRP, D-dimer, ferritin --monitor LFT's on remdesivir  Atrial fibrillation: rate controlled --Eliquis on hold due to hemoptysis - likely resume tomorrow --continue amiodarone, Coreg  COPD (chronic obstructive pulmonary disease) - not acutely exacerbated --Bronchodilators as above  Coronary artery disease and elevated trop: trop is minimally elevated at 33.  No chest pain.  Most likely due to COVID-19 infection and demand ischemia.  Troponin trended and flat (33->56->55->48->42), patient remains chest pain free. -Continue home Lipitor, as needed nitroglycerin, and Coreg  Essential hypertension: -prn Hydralazine -Continue home Coreg, hydralazine, Entresto -note: also on Lasix and spironolactone for CHF  Chronic systolic CHF (congestive heart failure): not acutely decompensated on admission.   Echo from 03/14/2019 showed EF 20-25%. Has trace to no LE edema. BNP 1150.  Some vascular congestion on  CXR, but no obvious  pulmonary edema.  Monitor volume status closely with low EF. -Continue home Lasix and spironolactone -Continue Entresto --strict I/O's and daily weights --low sodium and fluid restricted diet  CKD (chronic kidney disease), stage IIIa: Baseline creatinine 1.3-1.6, on admission was 1.82.  Given his low EF and risk of CHF decompensation, will continue his diuretics and Entresto. --Monitor renal function closely   Type II diabetes mellitus with renal manifestations: Last A1c 5.8 on 03/13/19, well controlled. --Hold home Metformin and glipizide  --sliding scale Novolog    DVT prophylaxis: SCD's for now, will resume Eliquis if not further hemoptysis   Code Status: Full Code  Family Communication: none at bedside Disposition Plan:  Expect discharge home pending improvement, estimate 2-3 days.  May be candidate for remdesivir infusion clinic if he does well.   Consultants:   None  Procedures:   None  Antimicrobials:   None    Objective: Vitals:   07/01/19 0207 07/01/19 0300 07/01/19 0600 07/01/19 0723  BP: (!) 172/99 (!) 152/84 (!) 159/84 (!) 167/99  Pulse: 61 (!) 51 (!) 48   Resp:   16   Temp:    97.7 F (36.5 C)  TempSrc:    Oral  SpO2: 95% 97% 97%   Weight:      Height:        Intake/Output Summary (Last 24 hours) at 07/01/2019 0751 Last data filed at 07/01/2019 0230 Gross per 24 hour  Intake 600.35 ml  Output 825 ml  Net -224.65 ml   Filed Weights   06/30/19 0649  Weight: (!) 151.5 kg    Examination:  General exam: awake, alert, no acute distress, obese HEENT: moist mucus membranes, hearing grossly normal  Respiratory system: clear to auscultation with diminished right mid/base posteriorly, no wheezes, rales or rhonchi, normal respiratory effort, on CPAP Cardiovascular system: normal S1/S2, RRR, no JVD, murmurs, rubs, gallops, trace LE edema.   Gastrointestinal system: protuberant abdomen, non-tender, no guarding or rebound Central  nervous system: alert and oriented x4. no gross focal neurologic deficits, normal speech Extremities: moves all, no cyanosis, normal tone Psychiatry: normal mood, congruent affect, judgement and insight appear normal    Data Reviewed: I have personally reviewed following labs and imaging studies  CBC: Recent Labs  Lab 06/30/19 0704 07/01/19 0331  WBC 5.5 3.6*  NEUTROABS  --  2.8  HGB 12.6* 12.7*  HCT 37.8* 38.3*  MCV 84.2 84.0  PLT 170 146*   Basic Metabolic Panel: Recent Labs  Lab 06/30/19 0704 07/01/19 0331  NA 138 140  K 3.5 3.7  CL 102 105  CO2 24 24  GLUCOSE 172* 198*  BUN 26* 27*  CREATININE 1.82* 1.28*  CALCIUM 8.5* 8.6*  MG  --  1.9   GFR: Estimated Creatinine Clearance: 88.4 mL/min (A) (by C-G formula based on SCr of 1.28 mg/dL (H)). Liver Function Tests: Recent Labs  Lab 06/30/19 0704 07/01/19 0331  AST 47* 56*  ALT 54* 62*  ALKPHOS 72 70  BILITOT 1.4* 1.5*  PROT 7.3 7.0  ALBUMIN 4.0 3.7   No results for input(s): LIPASE, AMYLASE in the last 168 hours. No results for input(s): AMMONIA in the last 168 hours. Coagulation Profile: Recent Labs  Lab 06/30/19 0704  INR 1.3*   Cardiac Enzymes: No results for input(s): CKTOTAL, CKMB, CKMBINDEX, TROPONINI in the last 168 hours. BNP (last 3 results) No results for input(s): PROBNP in the last 8760 hours. HbA1C: No results for input(s): HGBA1C in the last 72 hours. CBG:  Recent Labs  Lab 06/30/19 1719 06/30/19 2038  GLUCAP 160* 158*   Lipid Profile: Recent Labs    06/30/19 1451  TRIG 52   Thyroid Function Tests: No results for input(s): TSH, T4TOTAL, FREET4, T3FREE, THYROIDAB in the last 72 hours. Anemia Panel: Recent Labs    06/30/19 1451 07/01/19 0331  FERRITIN 274 294   Sepsis Labs: Recent Labs  Lab 06/30/19 1451  PROCALCITON 0.15    Recent Results (from the past 240 hour(s))  Respiratory Panel by RT PCR (Flu A&B, Covid) - Nasopharyngeal Swab     Status: Abnormal    Collection Time: 06/30/19  8:00 AM   Specimen: Nasopharyngeal Swab  Result Value Ref Range Status   SARS Coronavirus 2 by RT PCR POSITIVE (A) NEGATIVE Final    Comment: RESULT CALLED TO, READ BACK BY AND VERIFIED WITH:  Stephens November AT 0932 06/30/19 SDR (NOTE) SARS-CoV-2 target nucleic acids are DETECTED. SARS-CoV-2 RNA is generally detectable in upper respiratory specimens  during the acute phase of infection. Positive results are indicative of the presence of the identified virus, but do not rule out bacterial infection or co-infection with other pathogens not detected by the test. Clinical correlation with patient history and other diagnostic information is necessary to determine patient infection status. The expected result is Negative. Fact Sheet for Patients:  https://www.moore.com/ Fact Sheet for Healthcare Providers: https://www.young.biz/ This test is not yet approved or cleared by the [place] FDA and  has been authorized for detection and/or diagnosis of SARS-CoV-2 by FDA under an Emergency Use Authorization (EUA).  This EUA will remain in effect (meaning this test can be used) for  the duration of  the COVID-19 declaration under Section 564(b)(1) of the Act, 21 U.S.C. section 360bbb-3(b)(1), unless the authorization is terminated or revoked sooner.    Influenza A by PCR NEGATIVE NEGATIVE Final   Influenza B by PCR NEGATIVE NEGATIVE Final    Comment: (NOTE) The Xpert Xpress SARS-CoV-2/FLU/RSV assay is intended as an aid in  the diagnosis of influenza from Nasopharyngeal swab specimens and  should not be used as a sole basis for treatment. Nasal washings and  aspirates are unacceptable for Xpert Xpress SARS-CoV-2/FLU/RSV  testing. Fact Sheet for Patients: https://www.moore.com/ Fact Sheet for Healthcare Providers: https://www.young.biz/ This test is not yet approved or cleared by the [place] FDA and  has been authorized for detection and/or diagnosis of SARS-CoV-2 by  FDA under an Emergency Use Authorization (EUA). This EUA will remain  in effect (meaning this test can be used) for the duration of the  Covid-19 declaration under Section 564(b)(1) of the Act, 21  U.S.C. section 360bbb-3(b)(1), unless the authorization is  terminated or revoked. Performed at [facility], [address] [place], [place] [address]   Culture, blood (Routine X 2) w Reflex to ID Panel     Status: None (Preliminary result)   Collection Time: 06/30/19  2:59 PM   Specimen: BLOOD  Result Value Ref Range Status   Specimen Description BLOOD LEFT ANTECUBITAL  Final   Special Requests   Final    BOTTLES DRAWN AEROBIC AND ANAEROBIC Blood Culture adequate volume   Culture   Final    NO GROWTH < 24 HOURS Performed at [facility], [address] [place], [place] [address]    Report Status PENDING  Incomplete  Culture, blood (Routine X 2) w Reflex to ID Panel     Status: None (Preliminary result)   Collection Time: 06/30/19  3:05 PM   Specimen: BLOOD  Result Value Ref Range Status   Specimen Description BLOOD BLOOD LEFT HAND  Final   Special Requests   Final    BOTTLES DRAWN AEROBIC AND ANAEROBIC Blood Culture adequate volume   Culture   Final    NO GROWTH < 24 HOURS Performed at [facility], [address], [place], [place] [address]    Report Status PENDING  Incomplete         Radiology Studies: DG Chest 2 View  Result Date: 06/30/2019 CLINICAL DATA:  Shortness of breath EXAM: CHEST - 2 VIEW COMPARISON:  March 13, 2019 FINDINGS: There is airspace consolidation throughout much of the right middle lobe. There is atelectatic change in the left lower lung region. There is cardiomegaly with pulmonary venous hypertension. No adenopathy. There is aortic atherosclerosis. No bone lesions. IMPRESSION: Right middle lobe airspace consolidation consistent with  pneumonia. Atelectatic change left lower lung region. Cardiomegaly with pulmonary vascular congestion noted. No interstitial edema appreciable. Aortic Atherosclerosis (ICD10-I70.0). Electronically Signed   By: Lowella Grip III M.D.   On: 06/30/2019 08:16        Scheduled Meds: . allopurinol  100 mg Oral BID  . amiodarone  400 mg Oral Daily  . vitamin C  500 mg Oral Daily  . atorvastatin  20 mg Oral QHS  . carvedilol  25 mg Oral BID WC  . dextromethorphan-guaiFENesin  1 tablet Oral BID  . furosemide  20 mg Oral Daily  . hydrALAZINE  50 mg Oral BID  . influenza vac split quadrivalent PF  0.5 mL Intramuscular Tomorrow-1000  . insulin aspart  0-5 Units Subcutaneous QHS  . insulin aspart  0-9 Units Subcutaneous TID WC  . ipratropium  2 puff Inhalation Q4H  . methylPREDNISolone (SOLU-MEDROL) injection  80 mg Intravenous Q12H  . pneumococcal 23 valent vaccine  0.5 mL Intramuscular Tomorrow-1000  . potassium chloride  40 mEq Oral Once  . sacubitril-valsartan  0.5 tablet Oral BID  . spironolactone  12.5 mg Oral Daily  . zinc sulfate  220 mg Oral Daily   Continuous Infusions: . magnesium sulfate bolus IVPB    . remdesivir 100 mg in NS 100 mL       LOS: 1 day    Time spent: 35 minutes    Ezekiel Slocumb, DO [facility]   If 7PM-7AM, please contact night-coverage [email or web address] Password [place] 07/01/2019, 7:51 AM

## 2019-07-01 NOTE — Progress Notes (Signed)
NP notified of vtach runs and intermittent periods of bradycardia. No new orders.

## 2019-07-02 DIAGNOSIS — J449 Chronic obstructive pulmonary disease, unspecified: Secondary | ICD-10-CM

## 2019-07-02 LAB — COMPREHENSIVE METABOLIC PANEL
ALT: 60 U/L — ABNORMAL HIGH (ref 0–44)
AST: 49 U/L — ABNORMAL HIGH (ref 15–41)
Albumin: 3.3 g/dL — ABNORMAL LOW (ref 3.5–5.0)
Alkaline Phosphatase: 59 U/L (ref 38–126)
Anion gap: 11 (ref 5–15)
BUN: 32 mg/dL — ABNORMAL HIGH (ref 8–23)
CO2: 24 mmol/L (ref 22–32)
Calcium: 8.4 mg/dL — ABNORMAL LOW (ref 8.9–10.3)
Chloride: 105 mmol/L (ref 98–111)
Creatinine, Ser: 1.44 mg/dL — ABNORMAL HIGH (ref 0.61–1.24)
GFR calc Af Amer: 59 mL/min — ABNORMAL LOW (ref >60)
GFR calc non Af Amer: 51 mL/min — ABNORMAL LOW (ref >60)
Glucose, Bld: 236 mg/dL — ABNORMAL HIGH (ref 70–99)
Potassium: 4 mmol/L (ref 3.5–5.1)
Sodium: 140 mmol/L (ref 135–145)
Total Bilirubin: 1.1 mg/dL (ref 0.3–1.2)
Total Protein: 6.5 g/dL (ref 6.5–8.1)

## 2019-07-02 LAB — CBC WITH DIFFERENTIAL/PLATELET
Abs Immature Granulocytes: 0.05 10*3/uL (ref 0.00–0.07)
Basophils Absolute: 0 10*3/uL (ref 0.0–0.1)
Basophils Relative: 0 %
Eosinophils Absolute: 0 10*3/uL (ref 0.0–0.5)
Eosinophils Relative: 0 %
HCT: 41.4 % (ref 39.0–52.0)
Hemoglobin: 13.6 g/dL (ref 13.0–17.0)
Immature Granulocytes: 1 %
Lymphocytes Relative: 8 %
Lymphs Abs: 0.8 10*3/uL (ref 0.7–4.0)
MCH: 27.7 pg (ref 26.0–34.0)
MCHC: 32.9 g/dL (ref 30.0–36.0)
MCV: 84.3 fL (ref 80.0–100.0)
Monocytes Absolute: 0.4 10*3/uL (ref 0.1–1.0)
Monocytes Relative: 4 %
Neutro Abs: 8.5 10*3/uL — ABNORMAL HIGH (ref 1.7–7.7)
Neutrophils Relative %: 87 %
Platelets: 192 10*3/uL (ref 150–400)
RBC: 4.91 MIL/uL (ref 4.22–5.81)
RDW: 14.6 % (ref 11.5–15.5)
WBC: 9.7 10*3/uL (ref 4.0–10.5)
nRBC: 0 % (ref 0.0–0.2)

## 2019-07-02 LAB — MAGNESIUM: Magnesium: 2.2 mg/dL (ref 1.7–2.4)

## 2019-07-02 LAB — GLUCOSE, CAPILLARY
Glucose-Capillary: 218 mg/dL — ABNORMAL HIGH (ref 70–99)
Glucose-Capillary: 255 mg/dL — ABNORMAL HIGH (ref 70–99)
Glucose-Capillary: 282 mg/dL — ABNORMAL HIGH (ref 70–99)
Glucose-Capillary: 248 mg/dL — ABNORMAL HIGH (ref 70–99)
Glucose-Capillary: 272 mg/dL — ABNORMAL HIGH (ref 70–99)

## 2019-07-02 LAB — C-REACTIVE PROTEIN: CRP: 2.9 mg/dL — ABNORMAL HIGH (ref <1.0)

## 2019-07-02 LAB — FIBRIN DERIVATIVES D-DIMER (ARMC ONLY): Fibrin derivatives D-dimer (ARMC): 454.21 ng/mL (FEU) (ref 0.00–499.00)

## 2019-07-02 LAB — FERRITIN: Ferritin: 342 ng/mL — ABNORMAL HIGH (ref 24–336)

## 2019-07-02 MED ORDER — AMLODIPINE BESYLATE 5 MG PO TABS
5.0000 mg | ORAL_TABLET | Freq: Every day | ORAL | Status: DC
  Administered 2019-07-02 – 2019-07-03 (×2): 5 mg via ORAL
  Filled 2019-07-02 (×2): qty 1

## 2019-07-02 MED ORDER — HYDRALAZINE HCL 20 MG/ML IJ SOLN: 5.0000 mg | INTRAMUSCULAR | Status: DC | PRN

## 2019-07-02 MED ORDER — APIXABAN 5 MG PO TABS
5.0000 mg | ORAL_TABLET | Freq: Two times a day (BID) | ORAL | Status: DC
  Administered 2019-07-02 – 2019-07-03 (×2): 5 mg via ORAL
  Filled 2019-07-02 (×2): qty 1

## 2019-07-02 MED ORDER — LABETALOL HCL 5 MG/ML IV SOLN: 10.0000 mg | INTRAVENOUS | Status: DC | PRN

## 2019-07-02 NOTE — Progress Notes (Addendum)
PROGRESS NOTE    ANTHONYMICHAEL MUNDAY  TDV:[id number] DOB: Sep 14, 1955 DOA: 06/30/2019  PCP: [facility]    LOS - 2   Brief Narrative:  64 y.o.malewith medical history ofhypertension, hyperlipidemia, diabetes mellitus, COPD, cough, OSA, CAD,sCHF with EF 20-25%, A-fib on Eliquis, CKD stage IIIa, who presented to the ED on 06/30/19 with progressive shortness breath for > 1 week.  Associated dry cough, no fever/chills or GI symptoms.  In the ED, had some conversational dyspnea and hypoxia requiring 2 L/min oxygen, tachypneic, and hypertension 154/110.  Initial labs notable for positive COVID-19 PCR test, slightly worsened renal function from baseline, troponins mildly elevated were trended and flat, BNP 1150, no leukocytosis and elevated inflammatory markers including CRP 6.5, procalcitonin negative.  Chest x-ray showed right middle lobe infiltration, cardiomegaly and vascular congestion.  Patient admitted and started on treatment for COVID-19 pneumonia with Remdesivir, steroids, vitamin C and zinc, bronchodilators and antitussives.  Patient also started on empiric antibiotics for possible community-acquired bacterial pneumonia.  On review of chest x-ray, does appear more typical lobar pneumonia.  Subjective 1/16: Patient says he's feeling a little better today.  States he is scared about having covid, fearful he will get worse.  Advised his lungs sound better today, he was reassured.  Says his family is getting tested today.  Denies fever/chills, chest pain, N/V/D or other complaints.  Assessment & Plan:   Principal Problem:   Acute respiratory disease due to COVID-19 virus Active Problems:   COPD (chronic obstructive pulmonary disease) (HCC)   Chronic systolic CHF (congestive heart failure) (HCC)   Coronary artery disease   Hypertension   CKD (chronic kidney disease), stage IIIa   Type II diabetes mellitus with renal manifestations (HCC)   A-fib (HCC)   Acute hypoxemic  respiratory failure due to COVID-19 secondary to Pneumonia due to COVID-19 virus  vs Community-acquired Pneumonia Presented with progressive shortness of breath, hypoxia, chest x-ray showing right middle lobe pneumonia and Covid test positive. --Date of Dx:06/30/2019 --Oxygen requirements:2 L/min --Antibiotics:Procalcitonin negative, antibiotics held --Diuretics:History of HFrEF (20-25% on last echo), clinically euvolemic --maintain euvolemia to net-neg fluid balance --Vitamin C and Zinc:Per protocol --Remdesivir: Started on 1/14 --Steroids: Started on 1/14 --maintain airborne and contact precautions --supplemental oxygen, maintain o2 sat > 90% --Atrovent & Ventolin inhalers --Tussionex and Robitussin --encourage patient to maintain awake prone positive for 16+hours a day as possible, minimum 2-3 hours at night --monitor inflammatory markers daily CRP, D-dimer, ferritin --monitor LFT's on remdesivir  Atrial fibrillation: rate controlled --resume Eliquis (no further hemoptysis, but will monitor) --continueamiodarone, Coreg  COPD (chronic obstructive pulmonary disease) - not acutely exacerbated --Bronchodilatorsas above  Coronary artery diseaseand elevated trop: trop isminimally elevated at 33. No chest pain. Most likely due to COVID-19 infection and demand ischemia.  Troponin trended and flat (33->56->55->48->42), patient remains chest pain free. -Continue home Lipitor, as needed nitroglycerin, and Coreg  Essential hypertension:  Has had persistently elevated BP despite home meds. -add amlodipine 5 mg  -prn Hydralazine -Continue home Coreg, hydralazine, Entresto -note: also on Lasix and spironolactone for CHF  Chronic systolic CHF (congestive heart failure): not acutely decompensated on admission.   Echo from 03/14/2019 showed EF 20-25%. Has trace to no LE edema. BNP 1150.  Some vascular congestion on CXR, but noobvious pulmonary edema. Monitor volume status  closely with low EF. -Continue home Lasix and spironolactone -Continue Entresto --strict I/O's and daily weights --low sodium and fluid restricted diet  CKD (chronic kidney disease), stage IIIa:Baseline creatinine  1.3-1.6, on admission was 1.82.  Given his low EF and risk of CHF decompensation, will continue his diuretics and Entresto. --Monitor renal function closely   Type II diabetes mellitus with renal manifestations:Last A1c5.8 on 03/13/19,well controlled. --Hold home Metformin and glipizide --sliding scale Novolog    DVT prophylaxis: SCD's for now, will resume Eliquis if not further hemoptysis   Code Status: Full Code  Family Communication: none at bedside Disposition Plan:  Expect discharge home tomorrow or Monday.  May be candidate for remdesivir infusion clinic if need be.   Consultants:   None  Procedures:   None  Antimicrobials:   None     Objective: Vitals:   07/01/19 2140 07/01/19 2300 07/02/19 0500 07/02/19 0746  BP:  (!) 158/92  (!) 171/106  Pulse: 64   (!) 52  Resp: 19   (!) 22  Temp:  98.5 F (36.9 C)    TempSrc:  Axillary    SpO2: 95%   98%  Weight:   (!) 154.1 kg   Height:        Intake/Output Summary (Last 24 hours) at 07/02/2019 1141 Last data filed at 07/02/2019 0500 Gross per 24 hour  Intake 0 ml  Output 925 ml  Net -925 ml   Filed Weights   06/30/19 0649 07/02/19 0500  Weight: (!) 151.5 kg (!) 154.1 kg    Examination:  General exam: awake, alert, no acute distress, obese HEENT: hearing grossly normal, clear conjunctiva Respiratory system: clear slightly diminished at bases bilaterally, no wheezes, rales or rhonchi, normal respiratory effort.  On CPAP with 2 L/min oxygen. Cardiovascular system: normal S1/S2, RRR, no pedal edema.   Central nervous system: alert and oriented x4. no gross focal neurologic deficits, normal speech Extremities: moves all, no edema, normal tone Skin: dry, intact, normal  temperature Psychiatry: normal mood, congruent affect, judgement and insight appear normal    Data Reviewed: I have personally reviewed following labs and imaging studies  CBC: Recent Labs  Lab 06/30/19 0704 07/01/19 0331 07/02/19 0428  WBC 5.5 3.6* 9.7  NEUTROABS  --  2.8 8.5*  HGB 12.6* 12.7* 13.6  HCT 37.8* 38.3* 41.4  MCV 84.2 84.0 84.3  PLT 170 146* 192   Basic Metabolic Panel: Recent Labs  Lab 06/30/19 0704 07/01/19 0331 07/02/19 0428  NA 138 140 140  K 3.5 3.7 4.0  CL 102 105 105  CO2 24 24 24   GLUCOSE 172* 198* 236*  BUN 26* 27* 32*  CREATININE 1.82* 1.28* 1.44*  CALCIUM 8.5* 8.6* 8.4*  MG  --  1.9 2.2   GFR: Estimated Creatinine Clearance: 79.3 mL/min (A) (by C-G formula based on SCr of 1.44 mg/dL (H)). Liver Function Tests: Recent Labs  Lab 06/30/19 0704 07/01/19 0331 07/02/19 0428  AST 47* 56* 49*  ALT 54* 62* 60*  ALKPHOS 72 70 59  BILITOT 1.4* 1.5* 1.1  PROT 7.3 7.0 6.5  ALBUMIN 4.0 3.7 3.3*   No results for input(s): LIPASE, AMYLASE in the last 168 hours. No results for input(s): AMMONIA in the last 168 hours. Coagulation Profile: Recent Labs  Lab 06/30/19 0704  INR 1.3*   Cardiac Enzymes: No results for input(s): CKTOTAL, CKMB, CKMBINDEX, TROPONINI in the last 168 hours. BNP (last 3 results) No results for input(s): PROBNP in the last 8760 hours. HbA1C: No results for input(s): HGBA1C in the last 72 hours. CBG: Recent Labs  Lab 07/01/19 1104 07/01/19 1545 07/01/19 2106 07/02/19 0732 07/02/19 1128  GLUCAP 213* 228* 218*  218* 255*   Lipid Profile: Recent Labs    06/30/19 1451  TRIG 52   Thyroid Function Tests: No results for input(s): TSH, T4TOTAL, FREET4, T3FREE, THYROIDAB in the last 72 hours. Anemia Panel: Recent Labs    07/01/19 0331 07/02/19 0428  FERRITIN 294 342*   Sepsis Labs: Recent Labs  Lab 06/30/19 1451  PROCALCITON 0.15    Recent Results (from the past 240 hour(s))  Respiratory Panel by RT PCR  (Flu A&B, Covid) - Nasopharyngeal Swab     Status: Abnormal   Collection Time: 06/30/19  8:00 AM   Specimen: Nasopharyngeal Swab  Result Value Ref Range Status   SARS Coronavirus 2 by RT PCR POSITIVE (A) NEGATIVE Final    Comment: RESULT CALLED TO, READ BACK BY AND VERIFIED WITH:  Bryna Colander AT 8119 06/30/19 SDR (NOTE) SARS-CoV-2 target nucleic acids are DETECTED. SARS-CoV-2 RNA is generally detectable in upper respiratory specimens  during the acute phase of infection. Positive results are indicative of the presence of the identified virus, but do not rule out bacterial infection or co-infection with other pathogens not detected by the test. Clinical correlation with patient history and other diagnostic information is necessary to determine patient infection status. The expected result is Negative. Fact Sheet for Patients:  PinkCheek.be Fact Sheet for Healthcare Providers: GravelBags.it This test is not yet approved or cleared by the [place] FDA and  has been authorized for detection and/or diagnosis of SARS-CoV-2 by FDA under an Emergency Use Authorization (EUA).  This EUA will remain in effect (meaning this test can be used) for  the duration of  the COVID-19 declaration under Section 564(b)(1) of the Act, 21 U.S.C. section 360bbb-3(b)(1), unless the authorization is terminated or revoked sooner.    Influenza A by PCR NEGATIVE NEGATIVE Final   Influenza B by PCR NEGATIVE NEGATIVE Final    Comment: (NOTE) The Xpert Xpress SARS-CoV-2/FLU/RSV assay is intended as an aid in  the diagnosis of influenza from Nasopharyngeal swab specimens and  should not be used as a sole basis for treatment. Nasal washings and  aspirates are unacceptable for Xpert Xpress SARS-CoV-2/FLU/RSV  testing. Fact Sheet for Patients: PinkCheek.be Fact Sheet for Healthcare  Providers: GravelBags.it This test is not yet approved or cleared by the [place] FDA and  has been authorized for detection and/or diagnosis of SARS-CoV-2 by  FDA under an Emergency Use Authorization (EUA). This EUA will remain  in effect (meaning this test can be used) for the duration of the  Covid-19 declaration under Section 564(b)(1) of the Act, 21  U.S.C. section 360bbb-3(b)(1), unless the authorization is  terminated or revoked. Performed at [facility], [address] [place], [place] [address]   Culture, blood (Routine X 2) w Reflex to ID Panel     Status: None (Preliminary result)   Collection Time: 06/30/19  2:59 PM   Specimen: BLOOD  Result Value Ref Range Status   Specimen Description BLOOD LEFT ANTECUBITAL  Final   Special Requests   Final    BOTTLES DRAWN AEROBIC AND ANAEROBIC Blood Culture adequate volume   Culture   Final    NO GROWTH 2 DAYS Performed at [facility], [address] [place], [place] [address]    Report Status PENDING  Incomplete  Culture, blood (Routine X 2) w Reflex to ID Panel     Status: None (Preliminary result)   Collection Time: 06/30/19  3:05 PM   Specimen: BLOOD  Result Value Ref Range  Status   Specimen Description BLOOD BLOOD LEFT HAND  Final   Special Requests   Final    BOTTLES DRAWN AEROBIC AND ANAEROBIC Blood Culture adequate volume   Culture   Final    NO GROWTH 2 DAYS Performed at [facility], [address] [place], [place] [address]    Report Status PENDING  Incomplete         Radiology Studies: No results found.      Scheduled Meds: . allopurinol  100 mg Oral BID  . amiodarone  400 mg Oral Daily  . amLODipine  5 mg Oral Daily  . vitamin C  500 mg Oral Daily  . atorvastatin  20 mg Oral QHS  . carvedilol  25 mg Oral BID WC  . dextromethorphan-guaiFENesin  1 tablet Oral BID  . furosemide  20 mg Oral Daily  . hydrALAZINE  50 mg Oral BID  .  influenza vac split quadrivalent PF  0.5 mL Intramuscular Tomorrow-1000  . insulin aspart  0-5 Units Subcutaneous QHS  . insulin aspart  0-9 Units Subcutaneous TID WC  . ipratropium  2 puff Inhalation Q4H  . methylPREDNISolone (SOLU-MEDROL) injection  80 mg Intravenous Q12H  . pneumococcal 23 valent vaccine  0.5 mL Intramuscular Tomorrow-1000  . sacubitril-valsartan  0.5 tablet Oral BID  . spironolactone  12.5 mg Oral Daily  . zinc sulfate  220 mg Oral Daily   Continuous Infusions: . remdesivir 100 mg in NS 100 mL 100 mg (07/02/19 0959)     LOS: 2 days    Time spent: 30 minutes     Pennie Banter, DO [facility]   If 7PM-7AM, please contact night-coverage [email or web address] Password [facility] 07/02/2019, 11:41 AM

## 2019-07-02 NOTE — Progress Notes (Signed)
End of Shift Summary:  Date: 07/02/19 Shift: 0700-1500 Ambulatory: Standby with set up Significant Events: Patient napping with CPAP. Per Dr. Denton Lank, hold coreg if patient HR is brady below 60 bpm.

## 2019-07-02 NOTE — Progress Notes (Signed)
Patient states his inhaler helped with his breathing and would like for his CPAP pressure to be turned down to 14. Pressure adjusted per Patient request. Patient still pulling good tidal volumes and SATs maintaining. Will continue to monitor.

## 2019-07-02 NOTE — Progress Notes (Signed)
Lying with 2L [place] 98%. Sitting with 2L [place] 98% Sitting on RA 96% Walking 94% RA  Pt reports dizziness when standing at first.

## 2019-07-03 DIAGNOSIS — U071 COVID-19: Secondary | ICD-10-CM | POA: Diagnosis not present

## 2019-07-03 LAB — COMPREHENSIVE METABOLIC PANEL
ALT: 53 U/L — ABNORMAL HIGH (ref 0–44)
AST: 32 U/L (ref 15–41)
Albumin: 3.2 g/dL — ABNORMAL LOW (ref 3.5–5.0)
Alkaline Phosphatase: 56 U/L (ref 38–126)
Anion gap: 9 (ref 5–15)
BUN: 33 mg/dL — ABNORMAL HIGH (ref 8–23)
CO2: 27 mmol/L (ref 22–32)
Calcium: 8.3 mg/dL — ABNORMAL LOW (ref 8.9–10.3)
Chloride: 105 mmol/L (ref 98–111)
Creatinine, Ser: 1.29 mg/dL — ABNORMAL HIGH (ref 0.61–1.24)
GFR calc Af Amer: >60 mL/min (ref >60)
GFR calc non Af Amer: 58 mL/min — ABNORMAL LOW (ref >60)
Glucose, Bld: 275 mg/dL — ABNORMAL HIGH (ref 70–99)
Potassium: 4 mmol/L (ref 3.5–5.1)
Sodium: 141 mmol/L (ref 135–145)
Total Bilirubin: 1.2 mg/dL (ref 0.3–1.2)
Total Protein: 6.4 g/dL — ABNORMAL LOW (ref 6.5–8.1)

## 2019-07-03 LAB — GLUCOSE, CAPILLARY
Glucose-Capillary: 256 mg/dL — ABNORMAL HIGH (ref 70–99)
Glucose-Capillary: 285 mg/dL — ABNORMAL HIGH (ref 70–99)

## 2019-07-03 LAB — CBC WITH DIFFERENTIAL/PLATELET
Abs Immature Granulocytes: 0.09 10*3/uL — ABNORMAL HIGH (ref 0.00–0.07)
Basophils Absolute: 0 10*3/uL (ref 0.0–0.1)
Basophils Relative: 0 %
Eosinophils Absolute: 0 10*3/uL (ref 0.0–0.5)
Eosinophils Relative: 0 %
HCT: 39.8 % (ref 39.0–52.0)
Hemoglobin: 13.3 g/dL (ref 13.0–17.0)
Immature Granulocytes: 1 %
Lymphocytes Relative: 5 %
Lymphs Abs: 0.6 10*3/uL — ABNORMAL LOW (ref 0.7–4.0)
MCH: 28.1 pg (ref 26.0–34.0)
MCHC: 33.4 g/dL (ref 30.0–36.0)
MCV: 84.1 fL (ref 80.0–100.0)
Monocytes Absolute: 0.2 10*3/uL (ref 0.1–1.0)
Monocytes Relative: 2 %
Neutro Abs: 10.5 10*3/uL — ABNORMAL HIGH (ref 1.7–7.7)
Neutrophils Relative %: 92 %
Platelets: 202 10*3/uL (ref 150–400)
RBC: 4.73 MIL/uL (ref 4.22–5.81)
RDW: 14.3 % (ref 11.5–15.5)
WBC: 11.4 10*3/uL — ABNORMAL HIGH (ref 4.0–10.5)
nRBC: 0 % (ref 0.0–0.2)

## 2019-07-03 LAB — C-REACTIVE PROTEIN: CRP: 1 mg/dL — ABNORMAL HIGH (ref <1.0)

## 2019-07-03 LAB — FIBRIN DERIVATIVES D-DIMER (ARMC ONLY): Fibrin derivatives D-dimer (ARMC): 452.45 ng/mL (FEU) (ref 0.00–499.00)

## 2019-07-03 LAB — FERRITIN: Ferritin: 261 ng/mL (ref 24–336)

## 2019-07-03 LAB — MAGNESIUM: Magnesium: 2.2 mg/dL (ref 1.7–2.4)

## 2019-07-03 MED ORDER — ASPIRIN EC 81 MG PO TBEC: 81.0000 mg | DELAYED_RELEASE_TABLET | Freq: Every day | ORAL | Status: DC

## 2019-07-03 MED ORDER — ONDANSETRON HCL 4 MG/2ML IJ SOLN
4.0000 mg | Freq: Four times a day (QID) | INTRAMUSCULAR | Status: DC | PRN
  Administered 2019-07-03: 4 mg via INTRAVENOUS
  Filled 2019-07-03: qty 2

## 2019-07-03 MED ORDER — METHYLPREDNISOLONE SODIUM SUCC 125 MG IJ SOLR
60.0000 mg | INTRAMUSCULAR | Status: DC
  Administered 2019-07-03: 60 mg via INTRAVENOUS
  Filled 2019-07-03: qty 2

## 2019-07-03 MED ORDER — ASPIRIN 81 MG PO TBEC: 81.0000 mg | DELAYED_RELEASE_TABLET | Freq: Every day | ORAL | 1 refills | Status: AC

## 2019-07-03 MED ORDER — AMLODIPINE BESYLATE 5 MG PO TABS: 10.0000 mg | ORAL_TABLET | Freq: Every day | ORAL | 1 refills | Status: AC

## 2019-07-03 MED ORDER — ALBUTEROL SULFATE HFA 108 (90 BASE) MCG/ACT IN AERS: 2.0000 | INHALATION_SPRAY | RESPIRATORY_TRACT | 0 refills | Status: AC | PRN

## 2019-07-03 MED ORDER — INSULIN ASPART 100 UNIT/ML ~~LOC~~ SOLN
3.0000 [IU] | Freq: Three times a day (TID) | SUBCUTANEOUS | Status: DC
  Administered 2019-07-03 (×2): 3 [IU] via SUBCUTANEOUS
  Filled 2019-07-03 (×2): qty 1

## 2019-07-03 NOTE — Progress Notes (Signed)
Provided discharge instructions to patient and updated wife, Steward Drone, of pending discharge. Provided information via fax to [facility] per DO discharge instructions and informed patient and his wife. Patient stated understanding and no further questions at this time. Patient's wife instructed to wait in car and pick up patient at medical mall entrance. Patient's wife's ETA 1350.

## 2019-07-03 NOTE — Discharge Summary (Signed)
Physician Discharge Summary  John Powell IDP:[id number] DOB: 1955-12-13 DOA: 06/30/2019  PCP: [facility]  Admit date: 06/30/2019 Discharge date: 07/03/2019  Admitted From: Home Disposition:  Home  Recommendations for Outpatient Follow-up:  1. Follow up with PCP within 1 week 2. Please obtain CMP/CBC within one week, follow up LFT's after Remdesivir 3. Patient's Eliquis was stopped due to hemoptysis, resumed but hemoptysis recurred.  Patient was discharged on aspirin until Eliquis can be resumed.  Home Health: No  Equipment/Devices: None   Discharge Condition: Stable  CODE STATUS: Full  Diet recommendation: Heart Healthy, Carb Modified   Brief/Interim Summary:  63 y.o.malewith medical history ofhypertension, hyperlipidemia, diabetes mellitus, COPD, cough, OSA, CAD,sCHF with EF 20-25%,A-fibon Eliquis, CKD stage IIIa, who presented to the ED on 1/14/21with progressiveshortness breathfor > 1 week.Associated dry cough, no fever/chills or GI symptoms. In the ED, had some conversational dyspnea and hypoxia requiring 2 L/min oxygen, tachypneic, and hypertension 154/110. Initial labs notable for positive COVID-19 PCR test, slightly worsened renal function from baseline, troponins mildly elevated were trended and flat, BNP 1150, no leukocytosis and elevated inflammatory markers including CRP 6.5, procalcitonin negative. Chest x-ray showed right middle lobe infiltration, cardiomegaly and vascular congestion. Patient admitted and started on treatment for COVID-19 pneumonia with Remdesivir, steroids, vitamin C and zinc, bronchodilators and antitussives. Patient also started on empiric antibiotics for possible community-acquired bacterial pneumonia. On review of chest x-ray, does appear more typical lobar pneumonia. Patient was treated with remdesivir, steroids, vitamin C, zinc, bronchodilators, antitussives.  His symptoms improved, and patient reported only some residual  coughing, otherwise feels better.  He did not qualify for home oxygen, and had been weaned off the 2 L/min he initially required.  Patient is clinically improved and stable for discharge home today.    Regarding Eliquis - Patient's Eliquis was held during admission due to hemoptysis.  Attempted to resume Eliquis but hemoptysis recurred, so stopped Eliquis on discharge.  Started aspirin.  Recommended to patient close follow up with PCP regarding when to resume Eliquis.   Discharge Diagnoses: Principal Problem:   Acute respiratory disease due to COVID-19 virus Active Problems:   COPD (chronic obstructive pulmonary disease) (HCC)   Chronic systolic CHF (congestive heart failure) (HCC)   Coronary artery disease   Hypertension   CKD (chronic kidney disease), stage IIIa   Type II diabetes mellitus with renal manifestations (HCC)   A-fib (HCC)    Discharge Instructions   Discharge Instructions    Call MD for:   Complete by: As directed    Worsening shortness of breath   Call MD for:  extreme fatigue   Complete by: As directed    Call MD for:  temperature >100.4   Complete by: As directed    Diet - low sodium heart healthy   Complete by: As directed    Discharge instructions   Complete by: As directed    We stopped your Eliquis because of coughing up blood.  Please take baby aspirin daily for now. Follow up with PCP regarding when to restart Eliquis.  We started a new blood pressure medicine, amlodipine (aka Norvasc).  The steroids we were treating you with may have been causing your BP to be higher than normal.  Please monitor BP at home, write down readings and bring to your PCP follow up appointment next week.   Increase activity slowly   Complete by: As directed      Allergies as of 07/03/2019   No Known  Allergies     Medication List    STOP taking these medications   apixaban 5 MG Tabs tablet Commonly known as: ELIQUIS     TAKE these medications   albuterol 108 (90  Base) MCG/ACT inhaler Commonly known as: VENTOLIN HFA Inhale 2 puffs into the lungs every 4 (four) hours as needed for wheezing or shortness of breath.   allopurinol 100 MG tablet Commonly known as: ZYLOPRIM Take 100 mg by mouth 2 (two) times daily.   amiodarone 400 MG tablet Commonly known as: PACERONE Take 1 tablet (400 mg total) by mouth daily.   amLODipine 5 MG tablet Commonly known as: NORVASC Take 2 tablets (10 mg total) by mouth daily. Start taking on: July 04, 2019   aspirin 81 MG EC tablet Take 1 tablet (81 mg total) by mouth daily. Start taking on: July 04, 2019   atorvastatin 40 MG tablet Commonly known as: LIPITOR Take 20 mg by mouth at bedtime.   carvedilol 25 MG tablet Commonly known as: COREG Take 25 mg by mouth 2 (two) times daily with a meal. What changed: Another medication with the same name was removed. Continue taking this medication, and follow the directions you see here.   Entresto 97-103 MG Generic drug: sacubitril-valsartan Take 0.5 tablets by mouth 2 (two) times daily. What changed: Another medication with the same name was removed. Continue taking this medication, and follow the directions you see here.   furosemide 20 MG tablet Commonly known as: LASIX Take 1 tablet (20 mg total) by mouth daily.   glipiZIDE 5 MG tablet Commonly known as: GLUCOTROL Take 5 mg by mouth 2 (two) times daily before a meal.   hydrALAZINE 50 MG tablet Commonly known as: APRESOLINE Take 1 tablet (50 mg total) by mouth 2 (two) times daily.   metFORMIN 1000 MG tablet Commonly known as: GLUCOPHAGE Take 1,000 mg by mouth 2 (two) times daily with a meal.   nitroGLYCERIN 0.4 MG SL tablet Commonly known as: NITROSTAT Place 1 tablet (0.4 mg total) under the tongue every 5 (five) minutes as needed for chest pain.   potassium chloride 20 MEQ packet Commonly known as: KLOR-CON Take 20 mEq by mouth daily.   spironolactone 25 MG tablet Commonly known as:  ALDACTONE Take 0.5 tablets (12.5 mg total) by mouth daily.   SUPER B COMPLEX PO Take 1 tablet by mouth daily.       No Known Allergies  Consultations:  None    Procedures/Studies: DG Chest 2 View  Result Date: 06/30/2019 CLINICAL DATA:  Shortness of breath EXAM: CHEST - 2 VIEW COMPARISON:  March 13, 2019 FINDINGS: There is airspace consolidation throughout much of the right middle lobe. There is atelectatic change in the left lower lung region. There is cardiomegaly with pulmonary venous hypertension. No adenopathy. There is aortic atherosclerosis. No bone lesions. IMPRESSION: Right middle lobe airspace consolidation consistent with pneumonia. Atelectatic change left lower lung region. Cardiomegaly with pulmonary vascular congestion noted. No interstitial edema appreciable. Aortic Atherosclerosis (ICD10-I70.0). Electronically Signed   By: Bretta BangWilliam  Woodruff III M.D.   On: 06/30/2019 08:16       Subjective: Patient reports feeling good today.  Had another small amount of hemoptysis this AM, after resuming Eliquis yesterday.  Advised patient to stay off it and take aspirin for now.  Other than occasional cough, he reports feels better.  Agrees with plan to go home today.   Discharge Exam: Vitals:   07/03/19 0725 07/03/19 0827  BP: Marland Kitchen(!)  172/97   Pulse:  (!) 53  Resp:  14  Temp: 97.7 F (36.5 C)   SpO2:  95%   Vitals:   07/03/19 0400 07/03/19 0515 07/03/19 0725 07/03/19 0827  BP:   (!) 172/97   Pulse: (!) 56 (!) 49  (!) 53  Resp: 20 15  14   Temp:   97.7 F (36.5 C)   TempSrc:   Oral   SpO2: 97% 94%  95%  Weight:      Height:        General: Pt is alert, awake, not in acute distress Cardiovascular: RRR, S1/S2 +, no rubs, no gallops Respiratory: CTA but decreased at bases bilaterally, no wheezing, no rhonchi Abdominal: Soft, NT, ND, bowel sounds + Extremities: no edema, no cyanosis    The results of significant diagnostics from this hospitalization (including  imaging, microbiology, ancillary and laboratory) are listed below for reference.     Microbiology: Recent Results (from the past 240 hour(s))  Respiratory Panel by RT PCR (Flu A&B, Covid) - Nasopharyngeal Swab     Status: Abnormal   Collection Time: 06/30/19  8:00 AM   Specimen: Nasopharyngeal Swab  Result Value Ref Range Status   SARS Coronavirus 2 by RT PCR POSITIVE (A) NEGATIVE Final    Comment: RESULT CALLED TO, READ BACK BY AND VERIFIED WITH:  Stephens NovemberRIEL SMITH AT [id number] 06/30/19 SDR (NOTE) SARS-CoV-2 target nucleic acids are DETECTED. SARS-CoV-2 RNA is generally detectable in upper respiratory specimens  during the acute phase of infection. Positive results are indicative of the presence of the identified virus, but do not rule out bacterial infection or co-infection with other pathogens not detected by the test. Clinical correlation with patient history and other diagnostic information is necessary to determine patient infection status. The expected result is Negative. Fact Sheet for Patients:  https://www.moore.com/https://www.fda.gov/media/142436/download Fact Sheet for Healthcare Providers: https://www.young.biz/https://www.fda.gov/media/142435/download This test is not yet approved or cleared by the [place] FDA and  has been authorized for detection and/or diagnosis of SARS-CoV-2 by FDA under an Emergency Use Authorization (EUA).  This EUA will remain in effect (meaning this test can be used) for  the duration of  the COVID-19 declaration under Section 564(b)(1) of the Act, 21 U.S.C. section 360bbb-3(b)(1), unless the authorization is terminated or revoked sooner.    Influenza A by PCR NEGATIVE NEGATIVE Final   Influenza B by PCR NEGATIVE NEGATIVE Final    Comment: (NOTE) The Xpert Xpress SARS-CoV-2/FLU/RSV assay is intended as an aid in  the diagnosis of influenza from Nasopharyngeal swab specimens and  should not be used as a sole basis for treatment. Nasal washings and  aspirates are unacceptable for Xpert  Xpress SARS-CoV-2/FLU/RSV  testing. Fact Sheet for Patients: https://www.moore.com/https://www.fda.gov/media/142436/download Fact Sheet for Healthcare Providers: https://www.young.biz/https://www.fda.gov/media/142435/download This test is not yet approved or cleared by the [place] FDA and  has been authorized for detection and/or diagnosis of SARS-CoV-2 by  FDA under an Emergency Use Authorization (EUA). This EUA will remain  in effect (meaning this test can be used) for the duration of the  Covid-19 declaration under Section 564(b)(1) of the Act, 21  U.S.C. section 360bbb-3(b)(1), unless the authorization is  terminated or revoked. Performed at [facility], [address] [place], [place] [id number]   Culture, blood (Routine X 2) w Reflex to ID Panel     Status: None (Preliminary result)   Collection Time: 06/30/19  2:59 PM   Specimen: BLOOD  Result Value Ref Range Status   Specimen  Description BLOOD LEFT ANTECUBITAL  Final   Special Requests   Final    BOTTLES DRAWN AEROBIC AND ANAEROBIC Blood Culture adequate volume   Culture   Final    NO GROWTH 3 DAYS Performed at [facility], [address] [place], [place] [address]    Report Status PENDING  Incomplete  Culture, blood (Routine X 2) w Reflex to ID Panel     Status: None (Preliminary result)   Collection Time: 06/30/19  3:05 PM   Specimen: BLOOD  Result Value Ref Range Status   Specimen Description BLOOD BLOOD LEFT HAND  Final   Special Requests   Final    BOTTLES DRAWN AEROBIC AND ANAEROBIC Blood Culture adequate volume   Culture   Final    NO GROWTH 3 DAYS Performed at [facility], [address] [place], [place] [address]    Report Status PENDING  Incomplete     Labs: BNP (last 3 results) Recent Labs    03/13/19 1707 06/30/19 0704  BNP 476.0* 1,150.0*   Basic Metabolic Panel: Recent Labs  Lab 06/30/19 0704 07/01/19 0331 07/02/19 0428 07/03/19 0555  NA 138 140 140 141  K 3.5 3.7 4.0 4.0  CL 102 105 105  105  CO2 24 24 24 27   GLUCOSE 172* 198* 236* 275*  BUN 26* 27* 32* 33*  CREATININE 1.82* 1.28* 1.44* 1.29*  CALCIUM 8.5* 8.6* 8.4* 8.3*  MG  --  1.9 2.2 2.2   Liver Function Tests: Recent Labs  Lab 06/30/19 0704 07/01/19 0331 07/02/19 0428 07/03/19 0555  AST 47* 56* 49* 32  ALT 54* 62* 60* 53*  ALKPHOS 72 70 59 56  BILITOT 1.4* 1.5* 1.1 1.2  PROT 7.3 7.0 6.5 6.4*  ALBUMIN 4.0 3.7 3.3* 3.2*   No results for input(s): LIPASE, AMYLASE in the last 168 hours. No results for input(s): AMMONIA in the last 168 hours. CBC: Recent Labs  Lab 06/30/19 0704 07/01/19 0331 07/02/19 0428 07/03/19 0555  WBC 5.5 3.6* 9.7 11.4*  NEUTROABS  --  2.8 8.5* 10.5*  HGB 12.6* 12.7* 13.6 13.3  HCT 37.8* 38.3* 41.4 39.8  MCV 84.2 84.0 84.3 84.1  PLT 170 146* 192 202   Cardiac Enzymes: No results for input(s): CKTOTAL, CKMB, CKMBINDEX, TROPONINI in the last 168 hours. BNP: Invalid input(s): POCBNP CBG: Recent Labs  Lab 07/02/19 1128 07/02/19 1614 07/02/19 1831 07/02/19 2105 07/03/19 0732  GLUCAP 255* 282* 248* 272* 256*   D-Dimer No results for input(s): DDIMER in the last 72 hours. Hgb A1c No results for input(s): HGBA1C in the last 72 hours. Lipid Profile Recent Labs    06/30/19 1451  TRIG 52   Thyroid function studies No results for input(s): TSH, T4TOTAL, T3FREE, THYROIDAB in the last 72 hours.  Invalid input(s): FREET3 Anemia work up Recent Labs    07/02/19 0428 07/03/19 0555  FERRITIN 342* 261   Urinalysis    Component Value Date/Time   COLORURINE Yellow 08/31/2012 1722   APPEARANCEUR Clear 08/31/2012 1722   LABSPEC 1.028 08/31/2012 1722   PHURINE 5.0 08/31/2012 1722   GLUCOSEU >=500 08/31/2012 1722   HGBUR Negative 08/31/2012 1722   BILIRUBINUR Negative 08/31/2012 1722   KETONESUR 1+ 08/31/2012 1722   PROTEINUR Negative 08/31/2012 1722   NITRITE Negative 08/31/2012 1722   LEUKOCYTESUR Negative 08/31/2012 1722   Sepsis Labs Invalid input(s):  PROCALCITONIN,  WBC,  LACTICIDVEN Microbiology Recent Results (from the past 240 hour(s))  Respiratory Panel by RT PCR (Flu  A&B, Covid) - Nasopharyngeal Swab     Status: Abnormal   Collection Time: 06/30/19  8:00 AM   Specimen: Nasopharyngeal Swab  Result Value Ref Range Status   SARS Coronavirus 2 by RT PCR POSITIVE (A) NEGATIVE Final    Comment: RESULT CALLED TO, READ BACK BY AND VERIFIED WITH:  Bryna Colander AT [id number] 06/30/19 SDR (NOTE) SARS-CoV-2 target nucleic acids are DETECTED. SARS-CoV-2 RNA is generally detectable in upper respiratory specimens  during the acute phase of infection. Positive results are indicative of the presence of the identified virus, but do not rule out bacterial infection or co-infection with other pathogens not detected by the test. Clinical correlation with patient history and other diagnostic information is necessary to determine patient infection status. The expected result is Negative. Fact Sheet for Patients:  PinkCheek.be Fact Sheet for Healthcare Providers: GravelBags.it This test is not yet approved or cleared by the [place] FDA and  has been authorized for detection and/or diagnosis of SARS-CoV-2 by FDA under an Emergency Use Authorization (EUA).  This EUA will remain in effect (meaning this test can be used) for  the duration of  the COVID-19 declaration under Section 564(b)(1) of the Act, 21 U.S.C. section 360bbb-3(b)(1), unless the authorization is terminated or revoked sooner.    Influenza A by PCR NEGATIVE NEGATIVE Final   Influenza B by PCR NEGATIVE NEGATIVE Final    Comment: (NOTE) The Xpert Xpress SARS-CoV-2/FLU/RSV assay is intended as an aid in  the diagnosis of influenza from Nasopharyngeal swab specimens and  should not be used as a sole basis for treatment. Nasal washings and  aspirates are unacceptable for Xpert Xpress SARS-CoV-2/FLU/RSV  testing. Fact Sheet for  Patients: PinkCheek.be Fact Sheet for Healthcare Providers: GravelBags.it This test is not yet approved or cleared by the [place] FDA and  has been authorized for detection and/or diagnosis of SARS-CoV-2 by  FDA under an Emergency Use Authorization (EUA). This EUA will remain  in effect (meaning this test can be used) for the duration of the  Covid-19 declaration under Section 564(b)(1) of the Act, 21  U.S.C. section 360bbb-3(b)(1), unless the authorization is  terminated or revoked. Performed at [facility], [address] [place], [place] [address]   Culture, blood (Routine X 2) w Reflex to ID Panel     Status: None (Preliminary result)   Collection Time: 06/30/19  2:59 PM   Specimen: BLOOD  Result Value Ref Range Status   Specimen Description BLOOD LEFT ANTECUBITAL  Final   Special Requests   Final    BOTTLES DRAWN AEROBIC AND ANAEROBIC Blood Culture adequate volume   Culture   Final    NO GROWTH 3 DAYS Performed at [facility], [address] [place], [place] [address]    Report Status PENDING  Incomplete  Culture, blood (Routine X 2) w Reflex to ID Panel     Status: None (Preliminary result)   Collection Time: 06/30/19  3:05 PM   Specimen: BLOOD  Result Value Ref Range Status   Specimen Description BLOOD BLOOD LEFT HAND  Final   Special Requests   Final    BOTTLES DRAWN AEROBIC AND ANAEROBIC Blood Culture adequate volume   Culture   Final    NO GROWTH 3 DAYS Performed at [facility], [address], [place], [place] [address]    Report Status PENDING  Incomplete     Time coordinating discharge: Over 30 minutes  SIGNED:   Ezekiel Slocumb, DO  [facility] 07/03/2019, 10:44 AM   If 7PM-7AM, please contact night-coverage [email or web address] Password [email or web address]

## 2019-07-03 NOTE — Evaluation (Signed)
Physical Therapy Evaluation Patient Details Name: John Powell MRN: [id number] DOB: 12/08/1955 Today's Date: 07/03/2019   History of Present Illness  JA OHMAN is a 64 y.o. male with medical history significant of hypertension, hyperlipidemia, diabetes mellitus, COPD, cough, OSA, CAD, sCHF with EF 20-25%, atrial fibrillation on Eliquis, CKD stage IIIa, who presents with shortness breath.  Clinical Impression  Pt is a pleasant 64 year old male who was admitted for ARF secondary to covid 19. Pt performs bed mobility/transfers with independence and ambulation with cga and HHA on IV pole. Pt demonstrates deficits with endurance/power. All mobility performed on RA with education provided for energy conservation/pacing/pursed lip breathing. Needed 1 standing rest break during ambulation, however overall did extremely well and is at baseline level. Would benefit from skilled PT to address above deficits and promote optimal return to PLOF. Will continue to provide PT services while in acute admission, however will not require follow up PT services. Recommend pulm supervised exercise program to prevent deconditioning and managing chronic dx. Discussed with attending MD.     Follow Up Recommendations No PT follow up(lungs works pulm program)    Equipment Recommendations  None recommended by PT    Recommendations for Other Services       Precautions / Restrictions Precautions Precautions: Fall Restrictions Weight Bearing Restrictions: No      Mobility  Bed Mobility Overal bed mobility: Independent             General bed mobility comments: safe technique with upright posture.  Transfers Overall transfer level: Independent Equipment used: None             General transfer comment: safe technique performed with upright posture.   Ambulation/Gait Ambulation/Gait assistance: Min guard Gait Distance (Feet): 200 Feet Assistive device: IV Pole Gait Pattern/deviations:  Step-through pattern     General Gait Details: wide base of support with 1 HHA on IV pole. Does need 1 standing rest break and exhibits anxiety regarding monitioring O2 sats. O2 monitored on RA with sats >88%, however RR increased to 40 with exertion. No LOB noted  Stairs            Wheelchair Mobility    Modified Rankin (Stroke Patients Only)       Balance Overall balance assessment: Modified Independent                                           Pertinent Vitals/Pain Pain Assessment: No/denies pain    Home Living Family/patient expects to be discharged to:: Private residence Living Arrangements: Spouse/significant other Available Help at Discharge: Family Type of Home: House Home Access: Stairs to enter Entrance Stairs-Rails: None Technical brewer of Steps: 5 Home Layout: One level Home Equipment: Cane - single point      Prior Function Level of Independence: Independent with assistive device(s)         Comments: occasionally uses SPC if needed, no falls recentlyl.     Hand Dominance        Extremity/Trunk Assessment   Upper Extremity Assessment Upper Extremity Assessment: Overall WFL for tasks assessed    Lower Extremity Assessment Lower Extremity Assessment: Overall WFL for tasks assessed       Communication   Communication: No difficulties  Cognition Arousal/Alertness: Awake/alert Behavior During Therapy: WFL for tasks assessed/performed Overall Cognitive Status: Within Functional Limits for tasks assessed  General Comments      Exercises Other Exercises Other Exercises: educated on energy conservation including pacing and gait training in hallway with pursed lip breathing   Assessment/Plan    PT Assessment Patient needs continued PT services  PT Problem List Cardiopulmonary status limiting activity       PT Treatment Interventions Gait  training;Therapeutic exercise    PT Goals (Current goals can be found in the Care Plan section)  Acute Rehab PT Goals Patient Stated Goal: to go home PT Goal Formulation: With patient Time For Goal Achievement: 07/17/19 Potential to Achieve Goals: Good    Frequency Min 2X/week   Barriers to discharge        Co-evaluation               AM-PAC PT "6 Clicks" Mobility  Outcome Measure Help needed turning from your back to your side while in a flat bed without using bedrails?: None Help needed moving from lying on your back to sitting on the side of a flat bed without using bedrails?: None Help needed moving to and from a bed to a chair (including a wheelchair)?: None Help needed standing up from a chair using your arms (e.g., wheelchair or bedside chair)?: None Help needed to walk in hospital room?: None Help needed climbing 3-5 steps with a railing? : A Little 6 Click Score: 23    End of Session Equipment Utilized During Treatment: Gait belt Activity Tolerance: Patient tolerated treatment well Patient left: in bed(seated at EOB, MF risk) Nurse Communication: Mobility status PT Visit Diagnosis: Difficulty in walking, not elsewhere classified (R26.2)    Time: 5726-2035 PT Time Calculation (min) (ACUTE ONLY): 39 min   Charges:   PT Evaluation $PT Eval Low Complexity: 1 Low PT Treatments $Gait Training: 23-37 mins        Elizabeth Palau, PT, DPT [phone]   Maytte Jacot 07/03/2019, 9:56 AM

## 2019-07-05 LAB — CULTURE, BLOOD (ROUTINE X 2)
Culture: NO GROWTH
Culture: NO GROWTH
Special Requests: ADEQUATE
Special Requests: ADEQUATE

## 2019-07-15 ENCOUNTER — Other Ambulatory Visit: Payer: Self-pay

## 2019-07-15 ENCOUNTER — Emergency Department
Admission: EM | Admit: 2019-07-15 | Discharge: 2019-07-15 | Disposition: A | Discharge status: Home / Self Care | Payer: No Typology Code available for payment source | Attending: Student | Admitting: Student

## 2019-07-15 DIAGNOSIS — N1831 Chronic kidney disease, stage 3a: Secondary | ICD-10-CM | POA: Diagnosis not present

## 2019-07-15 DIAGNOSIS — J449 Chronic obstructive pulmonary disease, unspecified: Secondary | ICD-10-CM | POA: Diagnosis not present

## 2019-07-15 DIAGNOSIS — I5032 Chronic diastolic (congestive) heart failure: Secondary | ICD-10-CM | POA: Insufficient documentation

## 2019-07-15 DIAGNOSIS — R338 Other retention of urine: Secondary | ICD-10-CM | POA: Insufficient documentation

## 2019-07-15 DIAGNOSIS — Z7984 Long term (current) use of oral hypoglycemic drugs: Secondary | ICD-10-CM | POA: Insufficient documentation

## 2019-07-15 DIAGNOSIS — Z79899 Other long term (current) drug therapy: Secondary | ICD-10-CM | POA: Diagnosis not present

## 2019-07-15 DIAGNOSIS — I130 Hypertensive heart and chronic kidney disease with heart failure and stage 1 through stage 4 chronic kidney disease, or unspecified chronic kidney disease: Secondary | ICD-10-CM | POA: Diagnosis not present

## 2019-07-15 DIAGNOSIS — Z7982 Long term (current) use of aspirin: Secondary | ICD-10-CM | POA: Diagnosis not present

## 2019-07-15 DIAGNOSIS — R300 Dysuria: Secondary | ICD-10-CM | POA: Diagnosis not present

## 2019-07-15 DIAGNOSIS — E1122 Type 2 diabetes mellitus with diabetic chronic kidney disease: Secondary | ICD-10-CM | POA: Diagnosis not present

## 2019-07-15 DIAGNOSIS — R339 Retention of urine, unspecified: Secondary | ICD-10-CM | POA: Diagnosis not present

## 2019-07-15 DIAGNOSIS — I2510 Atherosclerotic heart disease of native coronary artery without angina pectoris: Secondary | ICD-10-CM | POA: Diagnosis not present

## 2019-07-15 LAB — URINALYSIS, COMPLETE (UACMP) WITH MICROSCOPIC
Bacteria, UA: NONE SEEN
Bilirubin Urine: NEGATIVE
Glucose, UA: NEGATIVE mg/dL
Hgb urine dipstick: NEGATIVE
Ketones, ur: NEGATIVE mg/dL
Leukocytes,Ua: NEGATIVE
Nitrite: POSITIVE — AB
Protein, ur: NEGATIVE mg/dL
Specific Gravity, Urine: 1.006 (ref 1.005–1.030)
Squamous Epithelial / HPF: NONE SEEN (ref 0–5)
WBC, UA: NONE SEEN WBC/hpf (ref 0–5)
pH: 7 (ref 5.0–8.0)

## 2019-07-15 NOTE — ED Provider Notes (Signed)
[facility] Emergency Department Provider Note ____________________________________________   First MD Initiated Contact with Patient 07/15/19 1916     (approximate)  I have reviewed the triage vital signs and the nursing notes.   HISTORY  Chief Complaint Urinary Retention  HPI John Powell is a 64 y.o. male presenting to the emergency department for treatment and evaluation of urinary retention.  He has been unable to void for the past 24 hours or so.  He has been taking Azo for dysuria.  He was recently hospitalized for COVID-19.  He reports that he has not had any issues with urinary retention or prostate enlargement. No fever or other symptoms of concern.     Past Medical History:  Diagnosis Date  . CHF (congestive heart failure) (HCC)   . COPD (chronic obstructive pulmonary disease) (HCC)   . Coronary artery disease   . Diabetes mellitus without complication (HCC)   . Hypertension   . Sleep apnea     Patient Active Problem List   Diagnosis Date Noted  . Acute respiratory disease due to COVID-19 virus 06/30/2019  . A-fib (HCC) 06/30/2019  . COPD (chronic obstructive pulmonary disease) (HCC)   . Coronary artery disease   . Hypertension   . Chronic systolic CHF (congestive heart failure) (HCC)   . CKD (chronic kidney disease), stage IIIa   . Type II diabetes mellitus with renal manifestations (HCC)   . Acute CHF (congestive heart failure) (HCC) 03/13/2019  . Acute on chronic diastolic CHF (congestive heart failure) (HCC) 03/13/2019    Past Surgical History:  Procedure Laterality Date  . CHOLECYSTECTOMY      Prior to Admission medications   Medication Sig Start Date End Date Taking? Authorizing Provider  albuterol (VENTOLIN HFA) 108 (90 Base) MCG/ACT inhaler Inhale 2 puffs into the lungs every 4 (four) hours as needed for wheezing or shortness of breath. 07/03/19   Pennie Banter, DO  allopurinol (ZYLOPRIM) 100 MG tablet Take 100 mg by  mouth 2 (two) times daily.    [provider]  amiodarone (PACERONE) 400 MG tablet Take 1 tablet (400 mg total) by mouth daily. 03/18/19   Altamese Dilling, MD  amLODipine (NORVASC) 5 MG tablet Take 2 tablets (10 mg total) by mouth daily. 07/04/19   Pennie Banter, DO  aspirin EC 81 MG EC tablet Take 1 tablet (81 mg total) by mouth daily. 07/04/19   Pennie Banter, DO  atorvastatin (LIPITOR) 40 MG tablet Take 20 mg by mouth at bedtime.    [provider]  B Complex-C (SUPER B COMPLEX PO) Take 1 tablet by mouth daily.    [provider]  carvedilol (COREG) 25 MG tablet Take 25 mg by mouth 2 (two) times daily with a meal.    [provider]  furosemide (LASIX) 20 MG tablet Take 1 tablet (20 mg total) by mouth daily. 03/18/19   Altamese Dilling, MD  glipiZIDE (GLUCOTROL) 5 MG tablet Take 5 mg by mouth 2 (two) times daily before a meal.     [provider]  hydrALAZINE (APRESOLINE) 50 MG tablet Take 1 tablet (50 mg total) by mouth 2 (two) times daily. 03/17/19   Altamese Dilling, MD  metFORMIN (GLUCOPHAGE) 1000 MG tablet Take 1,000 mg by mouth 2 (two) times daily with a meal.    [provider]  nitroGLYCERIN (NITROSTAT) 0.4 MG SL tablet Place 1 tablet (0.4 mg total) under the tongue every 5 (five) minutes as needed for  chest pain. 03/17/19   Altamese Dilling, MD  potassium chloride (KLOR-CON) 20 MEQ packet Take 20 mEq by mouth daily.     [provider]  sacubitril-valsartan (ENTRESTO) 97-103 MG Take 0.5 tablets by mouth 2 (two) times daily.    [provider]  spironolactone (ALDACTONE) 25 MG tablet Take 0.5 tablets (12.5 mg total) by mouth daily. 03/18/19   Altamese Dilling, MD    Allergies Patient has no known allergies.  Family History  Problem Relation Age of Onset  . Hypertension Mother     Social History Social History   Tobacco Use  . Smoking status: Never Smoker  . Smokeless  tobacco: Never Used  Substance Use Topics  . Alcohol use: Not Currently  . Drug use: Not Currently    Review of Systems  Constitutional: No fever/chills Eyes: No visual changes. ENT: No sore throat. Cardiovascular: Denies chest pain. Respiratory: Denies shortness of breath. Gastrointestinal: No abdominal pain.  No nausea, no vomiting.  No diarrhea.  No constipation. Genitourinary: Positive for dysuria.  Musculoskeletal: Negative for back pain. Skin: Negative for rash. Neurological: Negative for headaches, focal weakness or numbness. ____________________________________________   PHYSICAL EXAM:  VITAL SIGNS: ED Triage Vitals  Enc Vitals Group     BP 07/15/19 1858 (!) 180/104     Pulse Rate 07/15/19 1858 73     Resp 07/15/19 1858 20     Temp 07/15/19 1858 97.8 F (36.6 C)     Temp Source 07/15/19 1858 Oral     SpO2 07/15/19 1858 96 %     Weight 07/15/19 1853 (!) 334 lb (151.5 kg)     Height 07/15/19 1853 6' (1.829 m)     Head Circumference --      Peak Flow --      Pain Score 07/15/19 1853 10     Pain Loc --      Pain Edu? --      Excl. in GC? --     Constitutional: Alert and oriented. Well appearing and in no acute distress. Eyes: Conjunctivae are normal. PERRL. EOMI. Head: Atraumatic. Nose: No congestion/rhinnorhea. Mouth/Throat: Mucous membranes are moist. Oropharynx non-erythematous. Neck: No stridor.   Hematological/Lymphatic/Immunilogical: No cervical lymphadenopathy. Cardiovascular: Normal rate, regular rhythm. Grossly normal heart sounds.  Good peripheral circulation. Respiratory: Normal respiratory effort.  No retractions. Lungs CTAB. Gastrointestinal: Soft and nontender. No distention. No abdominal bruits. No CVA tenderness. Genitourinary: Visual exam deferred. Musculoskeletal: No lower extremity tenderness nor edema.  No joint effusions. Neurologic:  Normal speech and language. No gross focal neurologic deficits are appreciated. No gait  instability. Skin:  Skin is warm, dry and intact. No rash noted. Psychiatric: Mood and affect are normal. Speech and behavior are normal.  ____________________________________________   LABS (all labs ordered are listed, but only abnormal results are displayed)  Labs Reviewed  URINALYSIS, COMPLETE (UACMP) WITH MICROSCOPIC - Abnormal; Notable for the following components:      Result Value   Color, Urine AMBER (*)    APPearance CLEAR (*)    Nitrite POSITIVE (*)    All other components within normal limits   ____________________________________________  EKG  Not indicated. ____________________________________________  RADIOLOGY  Official radiology report(s): No results found.  ____________________________________________   PROCEDURES  Procedure(s) performed (including Critical Care):  Procedures  ____________________________________________   INITIAL IMPRESSION / ASSESSMENT AND PLAN     64 year old male presents to the emergency department for treatment and evaluation of urinary retention. RN reports over urine in  bladder. Plan will be to place foley and send UA.  DIFFERENTIAL DIAGNOSIS  Prostate enlargement, acute cystitis, obstruction  ED COURSE  Urinalysis is positive for nitrates which is most likely secondary to taking Azo.  Otherwise, urinalysis is unremarkable and does not show any indication of infection.  Foley catheter will be left in place and a leg bag provided.  The patient will be asked to call and schedule follow-up appointment with urology.  The patient is a [place] patient and was advised to call to see if they wish for him to be evaluated here or if they can get him in.  He was advised to return to the emergency department for symptoms of change or worsen if he is unable to schedule appointment. ____________________________________________   FINAL CLINICAL IMPRESSION(S) / ED DIAGNOSES  Final diagnoses:  Acute urinary retention     ED  Discharge Orders    None       John Powell was evaluated in Emergency Department on 07/15/2019 for the symptoms described in the history of present illness. He was evaluated in the context of the global COVID-19 pandemic, which necessitated consideration that the patient might be at risk for infection with the SARS-CoV-2 virus that causes COVID-19. Institutional protocols and algorithms that pertain to the evaluation of patients at risk for COVID-19 are in a state of rapid change based on information released by regulatory bodies including the CDC and federal and state organizations. These policies and algorithms were followed during the patient's care in the ED.   Note:  This document was prepared using Dragon voice recognition software and may include unintentional dictation errors.   Victorino Dike, FNP 07/15/19 2247    Lilia Pro., MD 07/16/19 [phone]

## 2019-07-15 NOTE — ED Triage Notes (Signed)
Pt to the er for difficulty urinating with dysuria and dribbling. Pt taking azo at home. Pt is currently covid positive but no issues with covid itself.

## 2019-07-27 ENCOUNTER — Encounter: Payer: Self-pay | Admitting: Urology

## 2019-07-27 ENCOUNTER — Ambulatory Visit (INDEPENDENT_AMBULATORY_CARE_PROVIDER_SITE_OTHER): Payer: Medicare Other | Admitting: Physician Assistant

## 2019-07-27 ENCOUNTER — Ambulatory Visit (INDEPENDENT_AMBULATORY_CARE_PROVIDER_SITE_OTHER): Payer: Medicare Other | Admitting: Urology

## 2019-07-27 ENCOUNTER — Other Ambulatory Visit: Payer: Self-pay

## 2019-07-27 VITALS — BP 129/78 | HR 73 | Ht 72.0 in | Wt 334.0 lb

## 2019-07-27 DIAGNOSIS — R339 Retention of urine, unspecified: Secondary | ICD-10-CM

## 2019-07-27 LAB — BLADDER SCAN AMB NON-IMAGING

## 2019-07-27 NOTE — Progress Notes (Signed)
07/27/2019 7:59 AM   [name] [birth year] [id number]  Referring provider: [facility] [address] [place],  [place] [address]  Chief Complaint  Patient presents with  . Follow-up    HPI: Elsie Sakuma is a 64 y.o. male seen in follow-up for an episode of acute urinary retention.  He was seen in the ED on 07/14/2018 with a 24-hour history of worsening voiding symptoms and subsequently inability to urinate.  Bladder scan showed a volume of 500 mL and Foley catheter was placed.  Urinalysis was unremarkable.  He denied constipation.  He states he had been placed on diuretics for CHF.  He was hospitalized in mid January for respiratory symptoms secondary to COVID-19 however had no catheter or voiding problems while he was hospitalized.  His only voiding symptoms prior to this episode was urinary frequency secondary to diuretics.  He was not placed on tamsulosin in the ED.  No history of constipation or OTC decongestants.   PMH: Past Medical History:  Diagnosis Date  . CHF (congestive heart failure) (HCC)   . COPD (chronic obstructive pulmonary disease) (HCC)   . Coronary artery disease   . Diabetes mellitus without complication (HCC)   . Hypertension   . Sleep apnea     Surgical History: Past Surgical History:  Procedure Laterality Date  . CHOLECYSTECTOMY      Home Medications:  Allergies as of 07/27/2019   No Known Allergies     Medication List       Accurate as of July 27, 2019  7:59 AM. If you have any questions, ask your nurse or doctor.        albuterol 108 (90 Base) MCG/ACT inhaler Commonly known as: VENTOLIN HFA Inhale 2 puffs into the lungs every 4 (four) hours as needed for wheezing or shortness of breath.   allopurinol 100 MG tablet Commonly known as: ZYLOPRIM Take 100 mg by mouth 2 (two) times daily.   amiodarone 400 MG tablet Commonly known as: PACERONE Take 1 tablet (400 mg total) by mouth daily.   amLODipine 5 MG tablet Commonly  known as: NORVASC Take 2 tablets (10 mg total) by mouth daily.   aspirin 81 MG EC tablet Take 1 tablet (81 mg total) by mouth daily.   atorvastatin 40 MG tablet Commonly known as: LIPITOR Take 20 mg by mouth at bedtime.   carvedilol 25 MG tablet Commonly known as: COREG Take 25 mg by mouth 2 (two) times daily with a meal.   Entresto 97-103 MG Generic drug: sacubitril-valsartan Take 0.5 tablets by mouth 2 (two) times daily.   furosemide 20 MG tablet Commonly known as: LASIX Take 1 tablet (20 mg total) by mouth daily.   glipiZIDE 5 MG tablet Commonly known as: GLUCOTROL Take 5 mg by mouth 2 (two) times daily before a meal.   hydrALAZINE 50 MG tablet Commonly known as: APRESOLINE Take 1 tablet (50 mg total) by mouth 2 (two) times daily.   metFORMIN 1000 MG tablet Commonly known as: GLUCOPHAGE Take 1,000 mg by mouth 2 (two) times daily with a meal.   nitroGLYCERIN 0.4 MG SL tablet Commonly known as: NITROSTAT Place 1 tablet (0.4 mg total) under the tongue every 5 (five) minutes as needed for chest pain.   potassium chloride 20 MEQ packet Commonly known as: KLOR-CON Take 20 mEq by mouth daily.   spironolactone 25 MG tablet Commonly known as: ALDACTONE Take 0.5 tablets (12.5 mg total) by mouth daily.   SUPER B  COMPLEX PO Take 1 tablet by mouth daily.       Allergies: No Known Allergies  Family History: Family History  Problem Relation Age of Onset  . Hypertension Mother     Social History:  reports that he has never smoked. He has never used smokeless tobacco. He reports previous alcohol use. He reports previous drug use.  ROS: UROLOGY Frequent Urination?: No Hard to postpone urination?: No Burning/pain with urination?: No Get up at night to urinate?: No Leakage of urine?: No Urine stream starts and stops?: No Trouble starting stream?: No Do you have to strain to urinate?: No Blood in urine?: No Urinary tract infection?: No Sexually transmitted  disease?: No Injury to kidneys or bladder?: No Painful intercourse?: No Weak stream?: No Erection problems?: No Penile pain?: No  Gastrointestinal Nausea?: No Vomiting?: No Indigestion/heartburn?: No Diarrhea?: No Constipation?: No  Constitutional Fever: No Night sweats?: No Weight loss?: No Fatigue?: No  Skin Skin rash/lesions?: No Itching?: No  Eyes Blurred vision?: No Double vision?: No  Ears/Nose/Throat Sore throat?: No Sinus problems?: No  Hematologic/Lymphatic Swollen glands?: No Easy bruising?: No  Cardiovascular Leg swelling?: No Chest pain?: No  Respiratory Cough?: No Shortness of breath?: No  Endocrine Excessive thirst?: No  Musculoskeletal Back pain?: No Joint pain?: No  Neurological Headaches?: No Dizziness?: No  Psychologic Depression?: No Anxiety?: No  Physical Exam: BP 129/78   Pulse 73   Ht 6' (1.829 m)   Wt (!) 334 lb (151.5 kg)   BMI 45.30 kg/m   Constitutional:  Alert and oriented, No acute distress. HEENT: [place] AT, moist mucus membranes.  Trachea midline, no masses. Cardiovascular: No clubbing, cyanosis, or edema. Respiratory: Normal respiratory effort, no increased work of breathing. GI: Abdomen is soft, nontender, nondistended, no abdominal masses GU: Phallus without lesions, testes descended bilaterally without masses or tenderness.  Spermatic cord/epididymis palpably normal bilaterally.  Foley catheter draining amber urine.  Prostate 45 g, smooth without nodules Skin: No rashes, bruises or suspicious lesions. Neurologic: Grossly intact, no focal deficits, moving all 4 extremities. Psychiatric: Normal mood and affect.   Assessment & Plan:    - Acute urinary retention Foley catheter removed for voiding trial.  He has a PA follow-up this afternoon for bladder scan for PVR.  He was instructed to call earlier for symptomatic urinary retention.  If having voiding difficulties or a residual would recommend starting  tamsulosin.  If voiding trial is successful follow-up with me 4-6 weeks for IPSS and bladder scan for PVR.   Abbie Sons, [place] [address] [place], [place] [address] [phone]

## 2019-07-27 NOTE — Progress Notes (Signed)
Patient returned to clinic this afternoon for PVR following catheter removal this morning.  He reports drinking approximately 56 ounces of fluid in the interim.  He has been able to urinate.  He denies difficulty urinating.  PVR 12 mL.  Results for orders placed or performed in visit on 07/27/19  BLADDER SCAN AMB NON-IMAGING  Result Value Ref Range   Scan Result 61ml     Voiding trial passed today. I counseled the patient on signs and symptoms of urinary retention, including lower abdominal pain, lumbar pain, abdominal distention, and the inability to urinate.  I advised him to contact the office for assistance if he develops these symptoms during routine office hours, 8 AM to 5 PM Monday through Friday.  If outside those hours, I advised him  to proceed to the emergency department. He expressed understanding.   Return in about 4 weeks (around 08/24/2019) for PVR and IPSS with Dr. Lonna Cobb.  Carman Ching, PA-C  07/27/19 1:23 PM

## 2019-08-31 ENCOUNTER — Ambulatory Visit: Payer: Self-pay | Admitting: Urology

## 2019-09-01 ENCOUNTER — Encounter: Payer: Self-pay | Admitting: Urology

## 2019-09-25 ENCOUNTER — Emergency Department
Admission: EM | Admit: 2019-09-25 | Discharge: 2019-09-25 | Disposition: A | Discharge status: Home / Self Care | Payer: No Typology Code available for payment source | Attending: Student in an Organized Health Care Education/Training Program | Admitting: Student in an Organized Health Care Education/Training Program

## 2019-09-25 ENCOUNTER — Encounter: Payer: Self-pay | Admitting: Emergency Medicine

## 2019-09-25 ENCOUNTER — Other Ambulatory Visit: Payer: Self-pay

## 2019-09-25 DIAGNOSIS — N183 Chronic kidney disease, stage 3 unspecified: Secondary | ICD-10-CM | POA: Insufficient documentation

## 2019-09-25 DIAGNOSIS — Z8616 Personal history of COVID-19: Secondary | ICD-10-CM | POA: Diagnosis not present

## 2019-09-25 DIAGNOSIS — K047 Periapical abscess without sinus: Secondary | ICD-10-CM | POA: Diagnosis not present

## 2019-09-25 DIAGNOSIS — R220 Localized swelling, mass and lump, head: Secondary | ICD-10-CM | POA: Diagnosis not present

## 2019-09-25 DIAGNOSIS — I130 Hypertensive heart and chronic kidney disease with heart failure and stage 1 through stage 4 chronic kidney disease, or unspecified chronic kidney disease: Secondary | ICD-10-CM | POA: Insufficient documentation

## 2019-09-25 DIAGNOSIS — Z79899 Other long term (current) drug therapy: Secondary | ICD-10-CM | POA: Diagnosis not present

## 2019-09-25 DIAGNOSIS — I2510 Atherosclerotic heart disease of native coronary artery without angina pectoris: Secondary | ICD-10-CM | POA: Insufficient documentation

## 2019-09-25 DIAGNOSIS — J449 Chronic obstructive pulmonary disease, unspecified: Secondary | ICD-10-CM | POA: Insufficient documentation

## 2019-09-25 DIAGNOSIS — I5022 Chronic systolic (congestive) heart failure: Secondary | ICD-10-CM | POA: Insufficient documentation

## 2019-09-25 HISTORY — DX: COVID-19: U07.1

## 2019-09-25 MED ORDER — OXYCODONE-ACETAMINOPHEN 5-325 MG PO TABS
1.0000 | ORAL_TABLET | Freq: Once | ORAL | Status: AC
  Administered 2019-09-25: 1 via ORAL
  Filled 2019-09-25: qty 1

## 2019-09-25 MED ORDER — HYDROCODONE-ACETAMINOPHEN 5-325 MG PO TABS: 1.0000 | ORAL_TABLET | Freq: Four times a day (QID) | ORAL | 0 refills | Status: AC | PRN

## 2019-09-25 MED ORDER — AMOXICILLIN-POT CLAVULANATE 875-125 MG PO TABS: 1.0000 | ORAL_TABLET | Freq: Two times a day (BID) | ORAL | 0 refills | Status: AC

## 2019-09-25 MED ORDER — AMOXICILLIN-POT CLAVULANATE 875-125 MG PO TABS
1.0000 | ORAL_TABLET | Freq: Once | ORAL | Status: AC
  Administered 2019-09-25: 13:00:00 1 via ORAL
  Filled 2019-09-25: qty 1

## 2019-09-25 NOTE — ED Notes (Addendum)
See triage note, pt c/o left sided facial swelling that started on Friday, c/o pain in lips/cheek area.  Swelling noted to lips, left cheek and above left eye. Pt denies difficulty breathing.  Pt reports taking ibuprofen this morning at 0900 with no relief.  Pt reports NKA Reports taking medications for BP but unsure which ones.

## 2019-09-25 NOTE — Discharge Instructions (Addendum)
Please take antibiotics as prescribed.  Please follow-up with dental clinic.  Return to the ER for any fevers increasing pain or swelling or difficulty swallowing.

## 2019-09-25 NOTE — ED Provider Notes (Signed)
[facility] EMERGENCY DEPARTMENT Provider Note   CSN: [id number] Arrival date & time: 09/25/19  1050     History Chief Complaint  Patient presents with  . Facial Swelling    John Powell is a 64 y.o. male presents to the emergency department for evaluation of left upper facial swelling.  He has a bad left upper lateral incisor with decay.  2 days ago developed left upper lip swelling, left sided facial pain and swelling.  Denies any fevers.  Has pain along the left lateral incisor.  No drainage.  No difficulty swallowing or breathing.  HPI     Past Medical History:  Diagnosis Date  . CHF (congestive heart failure) (HCC)   . COPD (chronic obstructive pulmonary disease) (HCC)   . Coronary artery disease   . COVID-05 Jul 2019  . Diabetes mellitus without complication (HCC)   . Hypertension   . Sleep apnea     Patient Active Problem List   Diagnosis Date Noted  . Acute respiratory disease due to COVID-19 virus 06/30/2019  . A-fib (HCC) 06/30/2019  . COPD (chronic obstructive pulmonary disease) (HCC)   . Coronary artery disease   . Hypertension   . Chronic systolic CHF (congestive heart failure) (HCC)   . CKD (chronic kidney disease), stage IIIa   . Type II diabetes mellitus with renal manifestations (HCC)   . Acute CHF (congestive heart failure) (HCC) 03/13/2019  . Acute on chronic diastolic CHF (congestive heart failure) (HCC) 03/13/2019    Past Surgical History:  Procedure Laterality Date  . CHOLECYSTECTOMY         Family History  Problem Relation Age of Onset  . Hypertension Mother     Social History   Tobacco Use  . Smoking status: Never Smoker  . Smokeless tobacco: Never Used  Substance Use Topics  . Alcohol use: Not Currently  . Drug use: Not Currently    Home Medications Prior to Admission medications   Medication Sig Start Date End Date Taking? Authorizing Provider  albuterol (VENTOLIN HFA) 108 (90 Base) MCG/ACT  inhaler Inhale 2 puffs into the lungs every 4 (four) hours as needed for wheezing or shortness of breath. 07/03/19   Pennie Banter, DO  allopurinol (ZYLOPRIM) 100 MG tablet Take 100 mg by mouth 2 (two) times daily.    [provider]  amiodarone (PACERONE) 400 MG tablet Take 1 tablet (400 mg total) by mouth daily. 03/18/19   Altamese Dilling, MD  amLODipine (NORVASC) 5 MG tablet Take 2 tablets (10 mg total) by mouth daily. 07/04/19   Esaw Grandchild A, DO  amoxicillin-clavulanate (AUGMENTIN) 875-125 MG tablet Take 1 tablet by mouth every 12 (twelve) hours for 7 days. 09/25/19 10/02/19  Evon Slack, PA-C  aspirin EC 81 MG EC tablet Take 1 tablet (81 mg total) by mouth daily. 07/04/19   Pennie Banter, DO  atorvastatin (LIPITOR) 40 MG tablet Take 20 mg by mouth at bedtime.    [provider]  B Complex-C (SUPER B COMPLEX PO) Take 1 tablet by mouth daily.    [provider]  carvedilol (COREG) 25 MG tablet Take 25 mg by mouth 2 (two) times daily with a meal.    [provider]  furosemide (LASIX) 20 MG tablet Take 1 tablet (20 mg total) by mouth daily. 03/18/19   Altamese Dilling, MD  glipiZIDE (GLUCOTROL) 5 MG tablet Take 5 mg by mouth 2 (two) times daily before a meal.  [provider]  hydrALAZINE (APRESOLINE) 50 MG tablet Take 1 tablet (50 mg total) by mouth 2 (two) times daily. 03/17/19   Altamese Dilling, MD  HYDROcodone-acetaminophen (NORCO) 5-325 MG tablet Take 1 tablet by mouth every 6 (six) hours as needed for moderate pain. 09/25/19   Evon Slack, PA-C  metFORMIN (GLUCOPHAGE) 1000 MG tablet Take 1,000 mg by mouth 2 (two) times daily with a meal.    [provider]  nitroGLYCERIN (NITROSTAT) 0.4 MG SL tablet Place 1 tablet (0.4 mg total) under the tongue every 5 (five) minutes as needed for chest pain. 03/17/19   Altamese Dilling, MD  potassium chloride (KLOR-CON) 20 MEQ packet Take 20 mEq by mouth daily.      [provider]  sacubitril-valsartan (ENTRESTO) 97-103 MG Take 0.5 tablets by mouth 2 (two) times daily.    [provider]  spironolactone (ALDACTONE) 25 MG tablet Take 0.5 tablets (12.5 mg total) by mouth daily. 03/18/19   Altamese Dilling, MD    Allergies    Patient has no known allergies.  Review of Systems   Review of Systems  Constitutional: Negative.  Negative for chills and fever.  HENT: Positive for dental problem and facial swelling. Negative for drooling, mouth sores, trouble swallowing and voice change.   Respiratory: Negative for shortness of breath.   Cardiovascular: Negative for chest pain.  Gastrointestinal: Negative for nausea and vomiting.  Musculoskeletal: Negative for arthralgias, neck pain and neck stiffness.  Skin: Negative.   Psychiatric/Behavioral: Negative for confusion.  All other systems reviewed and are negative.   Physical Exam Updated Vital Signs BP 139/83 (BP Location: Left Arm)   Pulse 64   Temp 98.2 F (36.8 C) (Oral)   Resp (!) 96   Ht 5\' 11"  (1.803 m)   Wt (!) 145.2 kg   SpO2 96%   BMI 44.63 kg/m   Physical Exam Constitutional:      General: He is not in acute distress.    Appearance: He is well-developed.  HENT:     Head: Normocephalic.     Jaw: No trismus.     Comments: Left upper lip swelling with mild maxillary tenderness and soft tissue swelling with slight erythema.  No fluctuant abscess.  Significant dental decay to the left lateral incisor.  Tenderness palpation just above the lateral incisor.  No pharyngeal swelling    Right Ear: External ear normal.     Left Ear: External ear normal.     Nose: Nose normal.     Mouth/Throat:     Mouth: No oral lesions.     Dentition: Normal dentition.     Pharynx: Uvula midline. No uvula swelling.  Cardiovascular:     Rate and Rhythm: Normal rate.     Heart sounds: No murmur. No friction rub. No gallop.   Pulmonary:     Effort: Pulmonary effort is normal. No  respiratory distress.     Breath sounds: Normal breath sounds.  Musculoskeletal:     Cervical back: Normal range of motion and neck supple.  Skin:    General: Skin is warm and dry.  Neurological:     Mental Status: He is alert and oriented to person, place, and time.  Psychiatric:        Behavior: Behavior normal.        Thought Content: Thought content normal.     ED Results / Procedures / Treatments   Labs (all labs ordered are listed, but only abnormal results are displayed)  Labs Reviewed - No data to display  EKG None  Radiology No results found.  Procedures Procedures (including critical care time)  Medications Ordered in ED Medications  oxyCODONE-acetaminophen (PERCOCET/ROXICET) 5-325 MG per tablet 1 tablet (has no administration in time range)  amoxicillin-clavulanate (AUGMENTIN) 875-125 MG per tablet 1 tablet (has no administration in time range)    ED Course  I have reviewed the triage vital signs and the nursing notes.  Pertinent labs & imaging results that were available during my care of the patient were reviewed by me and considered in my medical decision making (see chart for details).    MDM Rules/Calculators/A&P                      64 year old male with left upper dental abscess.  Start Augmentin given Norco.  He will follow with dental clinic.  He understands signs symptoms return to the ED for.  Vital signs are stable. Final Clinical Impression(s) / ED Diagnoses Final diagnoses:  Dental abscess  Facial swelling    Rx / DC Orders ED Discharge Orders         Ordered    HYDROcodone-acetaminophen (NORCO) 5-325 MG tablet  Every 6 hours PRN     09/25/19 1304    amoxicillin-clavulanate (AUGMENTIN) 875-125 MG tablet  Every 12 hours     09/25/19 1304           [place], Modi 09/25/19 1313    Merlyn Lot, MD 09/25/19 [phone]

## 2019-09-25 NOTE — ED Triage Notes (Addendum)
Pt arrived via POV with reports of left side facial swelling and redness since Friday, states the swelling started overnight, pt states he took 3 ibuprofen.  Swelling starts below left eye down to mouth, no tongue or lip swelling present pt reports he does have a broken tooth on the left side, but has not caused any problems.  Pt c/o pain to the touch. Tried to use some topical dental cream with no relief.

## 2019-11-02 ENCOUNTER — Ambulatory Visit: Payer: No Typology Code available for payment source | Admitting: Family

## 2019-11-25 ENCOUNTER — Other Ambulatory Visit: Payer: Self-pay | Admitting: *Deleted

## 2019-11-25 NOTE — Patient Outreach (Signed)
[facility]) Care Management  11/25/2019  JARED WHORLEY [birth year] [id number]   Telephone Screen  Referral Date:  11/22/2019 Referral Source:  EMMI Prevent Reason for Referral:  EMMI Prevent Screening/Join Care Management Insurance:  [employer]   Outreach Attempt:  Successful telephone outreach to patient for possible screening and confirmation of primary care provider. HIPAA verified with patient.  RN Health Coach introduced self.  Patient states he uses the [facility] as his providers and attends the [facility] for primary care.  Neither provider is in network with [facility].    Plan:  RN Health Coach will send patient Successful Letter with [facility] Brochure and encouraged patient to contact [facility] in the future if he establishes a [facility] in network provider.  RN Health Coach will close case at this time as patient not eligible for benefit.   Rhae Lerner RN [facility] Care Management  RN Health Coach [phone] [email or web address]

## 2020-08-20 IMAGING — CR DG CHEST 2V
1 series · 2 of 2 positions shown · non-contrast
Comparison: 10/04/2007

CLINICAL DATA: Shortness of breath

EXAM:
CHEST - 2 VIEW

[Series 1: dg chest 2 view · 0.14mm/px · 2 of 2 slices shown]
[im 1/2]
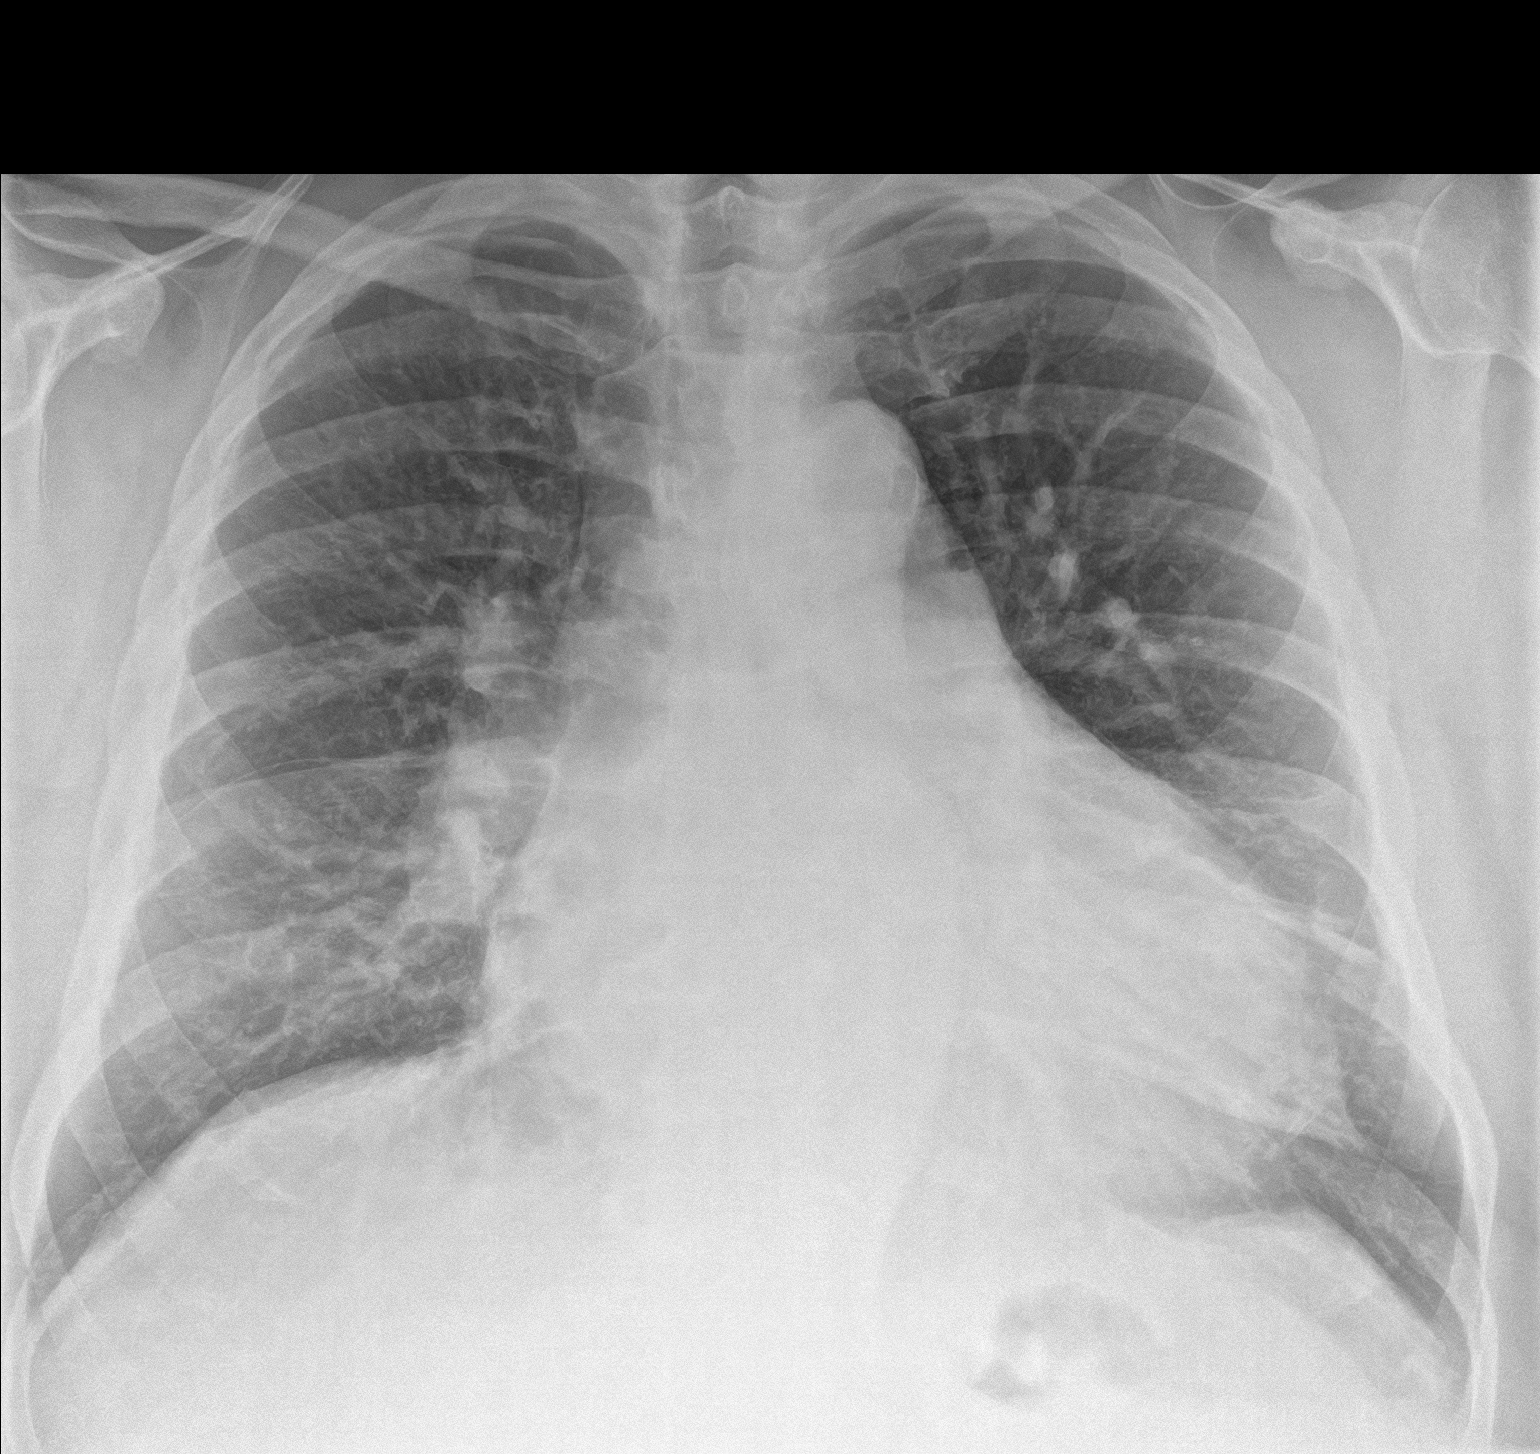
[im 2/2]
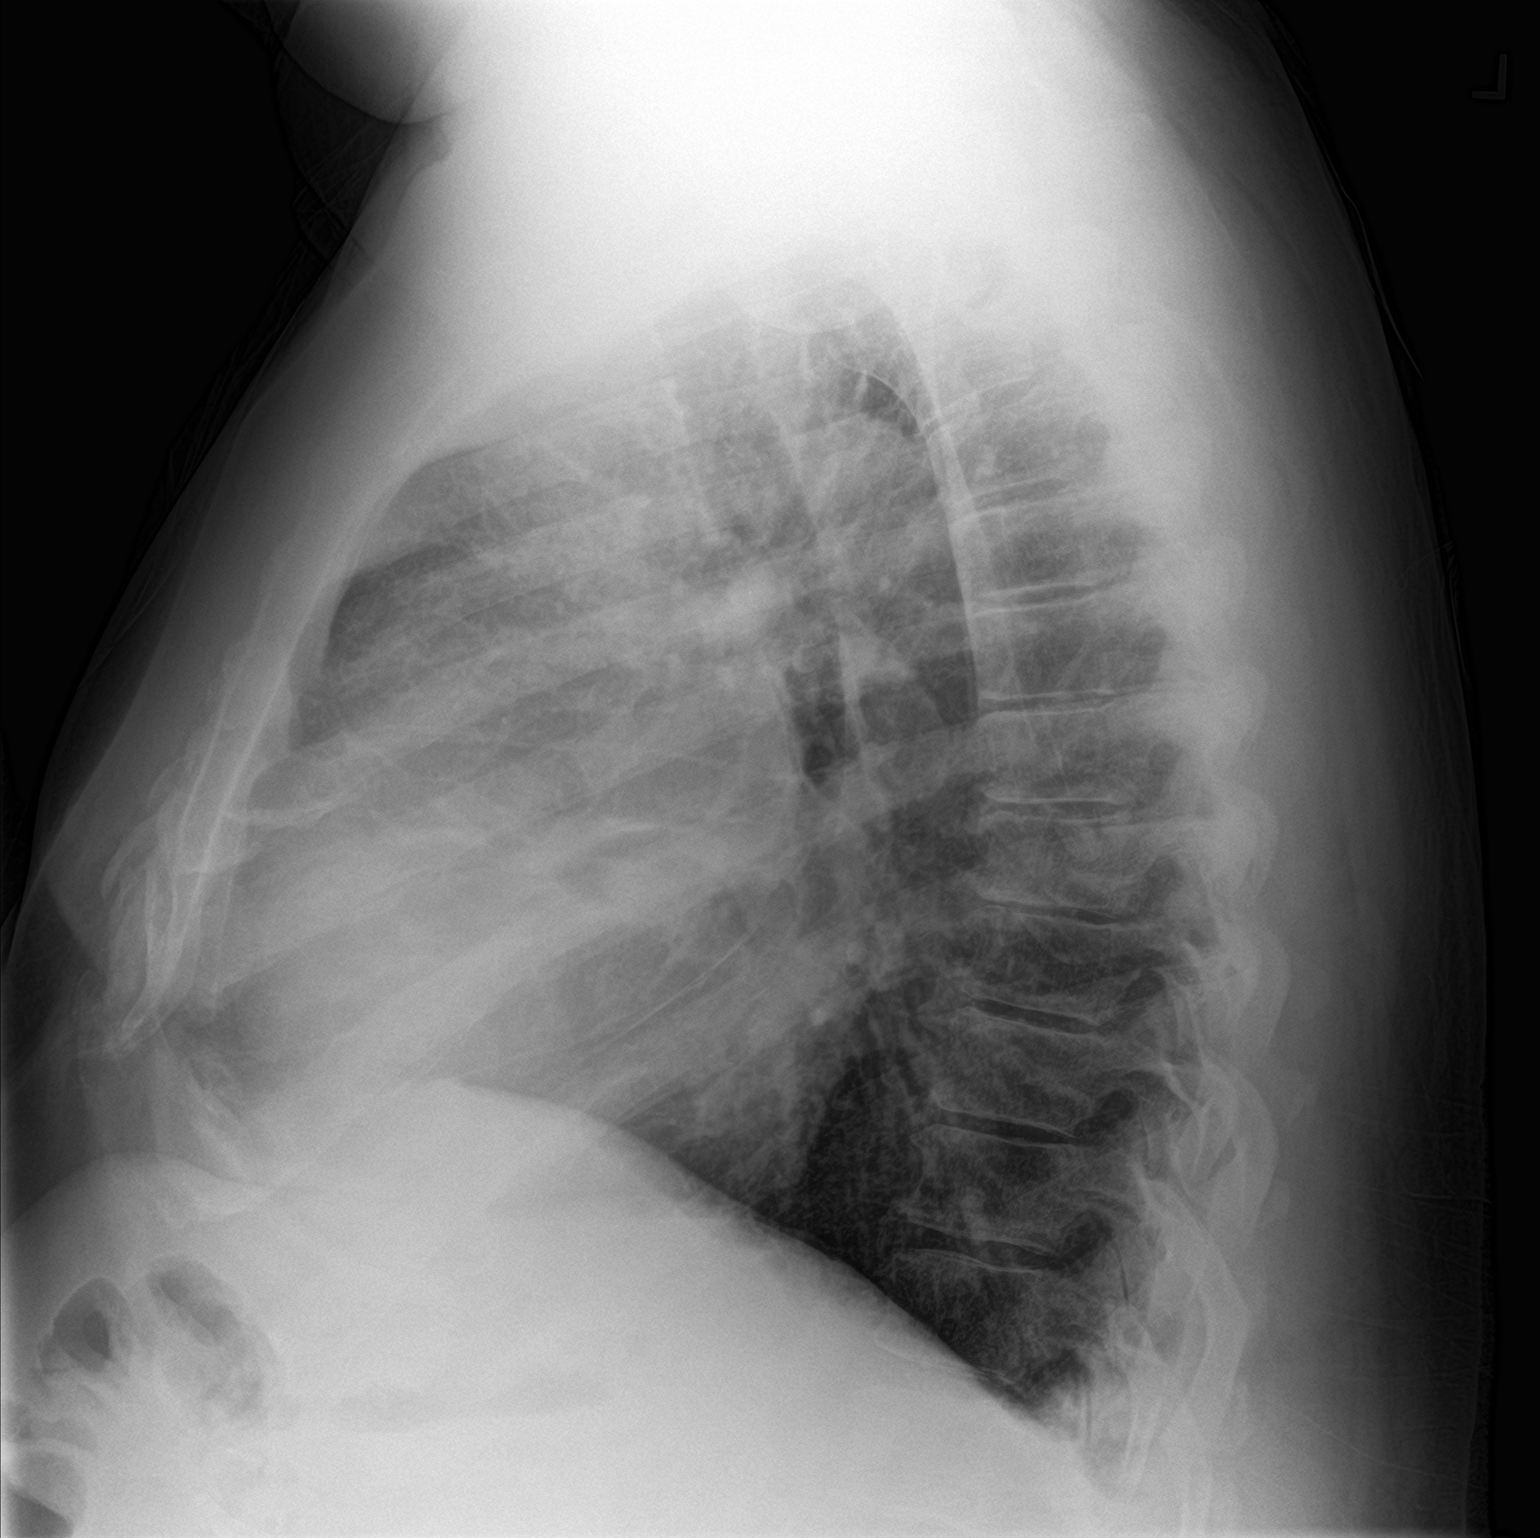

[2 of 2 positions shown; findings below may reference images not displayed]

FINDINGS: Cardiomegaly with central vascular congestion. No consolidation or
pleural effusion. No pneumothorax.
IMPRESSION: Cardiomegaly with vascular congestion.

## 2021-03-19 ENCOUNTER — Encounter: Payer: Self-pay | Admitting: Urology

## 2021-03-19 ENCOUNTER — Ambulatory Visit (INDEPENDENT_AMBULATORY_CARE_PROVIDER_SITE_OTHER): Payer: Medicare Other | Admitting: Urology

## 2021-03-19 ENCOUNTER — Other Ambulatory Visit: Payer: Self-pay

## 2021-03-19 VITALS — BP 137/83 | HR 80 | Ht 71.0 in | Wt 360.0 lb

## 2021-03-19 DIAGNOSIS — R339 Retention of urine, unspecified: Secondary | ICD-10-CM | POA: Diagnosis not present

## 2021-03-19 DIAGNOSIS — N453 Epididymo-orchitis: Secondary | ICD-10-CM

## 2021-03-19 LAB — BLADDER SCAN AMB NON-IMAGING: Scan Result: 0

## 2021-03-19 MED ORDER — DOXYCYCLINE HYCLATE 100 MG PO CAPS: 100.0000 mg | ORAL_CAPSULE | Freq: Two times a day (BID) | ORAL | 0 refills | Status: DC

## 2021-03-19 NOTE — Progress Notes (Signed)
Patient   03/19/2021 4:17 PM   John Powell Sep 28, 1955 [id number]  Referring provider: Center, [facility] [address] [place],  [place] [address]  Chief Complaint  Patient presents with   Testicle Pain   Urological history: 1. Urinary retention -episode in 2021 -passed TOV -did not show for follow up  HPI: John Powell is a 65 y.o. male who presents today with the complaint of a right sided testicular pain/swelling since Saturday.     UA negative benign   PVR 0 mL  He had the sudden onset of right sided testicular while watching TV.  Since the onset, the pain has increased.  He also stated his wife examined his scrotum was swollen.  He cannot give an opinion regarding his scrotal swelling as he cannot see his scrotum.    Patient denies any modifying or aggravating factors.  Patient denies any gross hematuria, dysuria or suprapubic/flank pain.  Patient denies any fevers, chills, nausea or vomiting.     PMH: Past Medical History:  Diagnosis Date   CHF (congestive heart failure) (HCC)    COPD (chronic obstructive pulmonary disease) (HCC)    Coronary artery disease    COVID-05 Jul 2019   Diabetes mellitus without complication (HCC)    Hypertension    Sleep apnea     Surgical History: Past Surgical History:  Procedure Laterality Date   CHOLECYSTECTOMY      Home Medications:  Allergies as of 03/19/2021       Reactions   Lisinopril    Other reaction(s): Cough   Ranolazine         Medication List        Accurate as of March 19, 2021  4:17 PM. If you have any questions, ask your nurse or doctor.          albuterol 108 (90 Base) MCG/ACT inhaler Commonly known as: VENTOLIN HFA Inhale 2 puffs into the lungs every 4 (four) hours as needed for wheezing or shortness of breath.   allopurinol 100 MG tablet Commonly known as: ZYLOPRIM Take 100 mg by mouth 2 (two) times daily.   amiodarone 400 MG tablet Commonly known as: PACERONE Take 1 tablet  (400 mg total) by mouth daily.   amLODipine 5 MG tablet Commonly known as: NORVASC Take 2 tablets (10 mg total) by mouth daily.   apixaban 5 MG Tabs tablet Commonly known as: ELIQUIS TAKE ONE TABLET BY MOUTH EVERY 12 HOURS CAUTION BLOOD THINNER   aspirin 81 MG EC tablet Take 1 tablet (81 mg total) by mouth daily.   atorvastatin 40 MG tablet Commonly known as: LIPITOR Take 20 mg by mouth at bedtime.   carvedilol 25 MG tablet Commonly known as: COREG Take 25 mg by mouth 2 (two) times daily with a meal.   doxycycline 100 MG capsule Commonly known as: VIBRAMYCIN Take 1 capsule (100 mg total) by mouth every 12 (twelve) hours. Started by: Michiel Cowboy, PA-C   empagliflozin 25 MG Tabs tablet Commonly known as: JARDIANCE Take 0.5 tablets by mouth daily.   Entresto 97-103 MG Generic drug: sacubitril-valsartan Take 0.5 tablets by mouth 2 (two) times daily.   furosemide 20 MG tablet Commonly known as: LASIX Take 1 tablet (20 mg total) by mouth daily.   glipiZIDE 5 MG tablet Commonly known as: GLUCOTROL Take 5 mg by mouth 2 (two) times daily before a meal.   hydrALAZINE 50 MG tablet Commonly known as: APRESOLINE Take 1 tablet (50 mg  total) by mouth 2 (two) times daily.   HYDROcodone-acetaminophen 5-325 MG tablet Commonly known as: Norco Take 1 tablet by mouth every 6 (six) hours as needed for moderate pain.   isosorbide mononitrate 60 MG 24 hr tablet Commonly known as: IMDUR TAKE ONE TABLET BY MOUTH ONCE EVERY DAY FOR HEART (DO NOT TAKE CIALIS, VIAGRA OR LEVITRA WHILE YOU ARE ON THIS MEDICATION).   levothyroxine 50 MCG tablet Commonly known as: SYNTHROID TAKE ONE TABLET BY MOUTH BEFORE BREAKFAST FOR THYROID   metFORMIN 1000 MG tablet Commonly known as: GLUCOPHAGE Take 1,000 mg by mouth 2 (two) times daily with a meal.   nitroGLYCERIN 0.4 MG SL tablet Commonly known as: NITROSTAT Place 1 tablet (0.4 mg total) under the tongue every 5 (five) minutes as needed for  chest pain.   potassium chloride 20 MEQ packet Commonly known as: KLOR-CON Take 20 mEq by mouth daily.   spironolactone 25 MG tablet Commonly known as: ALDACTONE Take 0.5 tablets (12.5 mg total) by mouth daily.   SUPER B COMPLEX PO Take 1 tablet by mouth daily.        Allergies:  Allergies  Allergen Reactions   Lisinopril     Other reaction(s): Cough   Ranolazine     Family History: Family History  Problem Relation Age of Onset   Hypertension Mother     Social History:  reports that he has never smoked. He has never used smokeless tobacco. He reports that he does not currently use alcohol. He reports that he does not currently use drugs.  ROS: Pertinent ROS in HPI  Physical Exam: BP 137/83   Pulse 80   Ht 5\' 11"  (1.803 m)   Wt (!) 360 lb (163.3 kg)   BMI 50.21 kg/m   Constitutional:  Well nourished. Alert and oriented, No acute distress. HEENT: [place] AT, moist mucus membranes.  Trachea midline, no masses. Cardiovascular: No clubbing, cyanosis, or edema. Respiratory: Normal respiratory effort, no increased work of breathing. GU: No CVA tenderness.  No bladder fullness or masses.  Patient with uncircumcised phallus. Foreskin easily retracted  Urethral meatus is patent.  No penile discharge. No penile lesions or rashes. Scrotum without lesions, cysts, rashes and/or edema.  Right testicle and epididymis are tender and indurated.  No crepitus, fluctuant, erythema or purulent drainage is noted.   Left testicle and epididymis are normal.   Neurologic: Grossly intact, no focal deficits, moving all 4 extremities. Psychiatric: Normal mood and affect.  Laboratory Data: Urinalysis Component     Latest Ref Rng & Units 03/19/2021  Specific Gravity, UA     1.005 - 1.030 1.015  pH, UA     5.0 - 7.5 5.5  Color, UA     Yellow Yellow  Appearance Ur     Clear Clear  Leukocytes,UA     Negative Negative  Protein,UA     Negative/Trace Negative  Glucose, UA     Negative 3+ (A)   Ketones, UA     Negative Negative  RBC, UA     Negative Negative  Bilirubin, UA     Negative Negative  Urobilinogen, Ur     0.2 - 1.0 mg/dL 0.2  Nitrite, UA     Negative Negative  Microscopic Examination      See below:   Component     Latest Ref Rng & Units 03/19/2021  WBC, UA     0 - 5 /hpf 0-5  RBC     0 - 2 /hpf 0-2  Epithelial Cells (non renal)     0 - 10 /hpf 0-10  Bacteria, UA     None seen/Few None seen  I have reviewed the labs.   Pertinent Imaging: Results for ADAN, BAEHR (MRN [id number]) as of 03/19/2021 16:15  Ref. Range 03/19/2021 16:06  Scan Result Unknown 0    Assessment & Plan:    1.  Right orchitis/epididymitis -Could not prescribe Levaquin due to the interaction with his amiodarone -Prescribe doxycycline 100 mg twice daily for 10 days -Patient return in 1 week for symptom recheck and exam -Patient is advised that if they should start to experience pain that is not able to be controlled with pain medication, intractable nausea and/or vomiting and/or fevers greater than 103 or shaking chills to contact the office immediately or seek treatment in the emergency department for emergent intervention.     Return in about 1 week (around 03/26/2021) for Exam and symptom recheck.  These notes generated with voice recognition software. I apologize for typographical errors.  Michiel Cowboy, PA-C  [facility] [address]  [address] [place], [place] [address] [phone]

## 2021-03-20 LAB — URINALYSIS, COMPLETE
Bilirubin, UA: NEGATIVE
Ketones, UA: NEGATIVE
Leukocytes,UA: NEGATIVE
Nitrite, UA: NEGATIVE
Protein,UA: NEGATIVE
RBC, UA: NEGATIVE
Specific Gravity, UA: 1.015 (ref 1.005–1.030)
Urobilinogen, Ur: 0.2 mg/dL (ref 0.2–1.0)
pH, UA: 5.5 (ref 5.0–7.5)

## 2021-03-20 LAB — MICROSCOPIC EXAMINATION: Bacteria, UA: NONE SEEN

## 2021-03-26 NOTE — Progress Notes (Signed)
03/27/2021 1:22 PM   RYLIE KNIERIM December 01, 1955 [id number]  Referring provider: Center, [facility] [address] [place],  [place] [address]  Chief Complaint  Patient presents with   epididymitis    Urological history: 1. Urinary retention -episode in 2021 -passed TOV -did not show for follow up -PVR 0 mL  HPI: John Powell is a 65 y.o. male who presents today for recheck on his orchitis/epididymitis.   He states that after his appointment with me last week he did notice more swelling, but the swelling has returned to baseline and the pain has abated.  Patient denies any modifying or aggravating factors.  Patient denies any gross hematuria, dysuria or suprapubic/flank pain.  Patient denies any fevers, chills, nausea or vomiting.    PMH: Past Medical History:  Diagnosis Date   CHF (congestive heart failure) (HCC)    COPD (chronic obstructive pulmonary disease) (HCC)    Coronary artery disease    COVID-05 Jul 2019   Diabetes mellitus without complication (HCC)    Hypertension    Sleep apnea     Surgical History: Past Surgical History:  Procedure Laterality Date   CHOLECYSTECTOMY      Home Medications:  Allergies as of 03/27/2021       Reactions   Lisinopril    Other reaction(s): Cough   Ranolazine         Medication List        Accurate as of March 27, 2021  1:22 PM. If you have any questions, ask your nurse or doctor.          albuterol 108 (90 Base) MCG/ACT inhaler Commonly known as: VENTOLIN HFA Inhale 2 puffs into the lungs every 4 (four) hours as needed for wheezing or shortness of breath.   allopurinol 100 MG tablet Commonly known as: ZYLOPRIM Take 100 mg by mouth 2 (two) times daily.   amiodarone 400 MG tablet Commonly known as: PACERONE Take 1 tablet (400 mg total) by mouth daily.   amLODipine 5 MG tablet Commonly known as: NORVASC Take 2 tablets (10 mg total) by mouth daily.   apixaban 5 MG Tabs tablet Commonly known as:  ELIQUIS TAKE ONE TABLET BY MOUTH EVERY 12 HOURS CAUTION BLOOD THINNER   aspirin 81 MG EC tablet Take 1 tablet (81 mg total) by mouth daily.   atorvastatin 40 MG tablet Commonly known as: LIPITOR Take 20 mg by mouth at bedtime.   carvedilol 25 MG tablet Commonly known as: COREG Take 25 mg by mouth 2 (two) times daily with a meal.   doxycycline 100 MG capsule Commonly known as: VIBRAMYCIN Take 1 capsule (100 mg total) by mouth every 12 (twelve) hours.   empagliflozin 25 MG Tabs tablet Commonly known as: JARDIANCE Take 0.5 tablets by mouth daily.   Entresto 97-103 MG Generic drug: sacubitril-valsartan Take 0.5 tablets by mouth 2 (two) times daily.   furosemide 20 MG tablet Commonly known as: LASIX Take 1 tablet (20 mg total) by mouth daily.   glipiZIDE 5 MG tablet Commonly known as: GLUCOTROL Take 5 mg by mouth 2 (two) times daily before a meal.   hydrALAZINE 50 MG tablet Commonly known as: APRESOLINE Take 1 tablet (50 mg total) by mouth 2 (two) times daily.   HYDROcodone-acetaminophen 5-325 MG tablet Commonly known as: Norco Take 1 tablet by mouth every 6 (six) hours as needed for moderate pain.   isosorbide mononitrate 60 MG 24 hr tablet Commonly known as: IMDUR TAKE  ONE TABLET BY MOUTH ONCE EVERY DAY FOR HEART (DO NOT TAKE CIALIS, VIAGRA OR LEVITRA WHILE YOU ARE ON THIS MEDICATION).   levothyroxine 50 MCG tablet Commonly known as: SYNTHROID TAKE ONE TABLET BY MOUTH BEFORE BREAKFAST FOR THYROID   metFORMIN 1000 MG tablet Commonly known as: GLUCOPHAGE Take 1,000 mg by mouth 2 (two) times daily with a meal.   nitroGLYCERIN 0.4 MG SL tablet Commonly known as: NITROSTAT Place 1 tablet (0.4 mg total) under the tongue every 5 (five) minutes as needed for chest pain.   potassium chloride 20 MEQ packet Commonly known as: KLOR-CON Take 20 mEq by mouth daily.   spironolactone 25 MG tablet Commonly known as: ALDACTONE Take 0.5 tablets (12.5 mg total) by mouth  daily.   SUPER B COMPLEX PO Take 1 tablet by mouth daily.        Allergies:  Allergies  Allergen Reactions   Lisinopril     Other reaction(s): Cough   Ranolazine     Family History: Family History  Problem Relation Age of Onset   Hypertension Mother     Social History:  reports that he has never smoked. He has never used smokeless tobacco. He reports that he does not currently use alcohol. He reports that he does not currently use drugs.  ROS: Pertinent ROS in HPI  Physical Exam: BP (!) 147/84   Pulse 69   Ht 5\' 11"  (1.803 m)   Wt (!) 352 lb (159.7 kg)   BMI 49.09 kg/m   Constitutional:  Well nourished. Alert and oriented, No acute distress. HEENT: [place] AT, mask in place  Trachea midline Cardiovascular: No clubbing, cyanosis, or edema. Respiratory: Normal respiratory effort, no increased work of breathing. GU: No CVA tenderness.  No bladder fullness or masses.  Patient with uncircumcised phallus. Foreskin easily retracted  Urethral meatus is patent.  No penile discharge. No penile lesions or rashes. Scrotum without lesions, cysts, rashes and/or edema.  Right hemi-scrotum is enlarged, indurated, but no pain.  No fluctuance, crepitus or erythema noted.  Testicles are located scrotally bilaterally. No masses are appreciated in the testicles. Left epididymis are normal. Neurologic: Grossly intact, no focal deficits, moving all 4 extremities. Psychiatric: Normal mood and affect.   Laboratory Data:   I have reviewed the labs.   Pertinent Imaging: Results for BORUCH, MANUELE (MRN [name]) as of 03/19/2021 16:15  Ref. Range 03/19/2021 16:06  Scan Result Unknown 0    Assessment & Plan:    1.  Right orchitis/epididymitis -Could not prescribe Levaquin due to the interaction with his amiodarone -continue doxycycline 100 mg twice daily for 10 days -Patient return in 2 week for symptom recheck and exam -Patient is advised that if they should start to experience pain that  is not able to be controlled with pain medication, intractable nausea and/or vomiting and/or fevers greater than 103 or shaking chills to contact the office immediately or seek treatment in the emergency department for emergent intervention.     Return in about 2 weeks (around 04/10/2021).  These notes generated with voice recognition software. I apologize for typographical errors.  04/12/2021, PA-C  [facility] [address]  [address] [place], [place] [phone]

## 2021-03-27 ENCOUNTER — Other Ambulatory Visit: Payer: Self-pay

## 2021-03-27 ENCOUNTER — Ambulatory Visit (INDEPENDENT_AMBULATORY_CARE_PROVIDER_SITE_OTHER): Payer: Medicare Other | Admitting: Urology

## 2021-03-27 ENCOUNTER — Encounter: Payer: Self-pay | Admitting: Urology

## 2021-03-27 VITALS — BP 147/84 | HR 69 | Ht 71.0 in | Wt 352.0 lb

## 2021-03-27 DIAGNOSIS — N453 Epididymo-orchitis: Secondary | ICD-10-CM | POA: Diagnosis not present

## 2021-03-27 MED ORDER — DOXYCYCLINE HYCLATE 100 MG PO CAPS: 100.0000 mg | ORAL_CAPSULE | Freq: Two times a day (BID) | ORAL | 0 refills | Status: DC

## 2021-03-29 LAB — CULTURE, URINE COMPREHENSIVE

## 2021-04-09 NOTE — Progress Notes (Signed)
04/10/2021 5:26 PM   John Powell 07/16/1955 [id number]  Referring provider: Center, [facility] [address] [place],  [place] [address]  Chief Complaint  Patient presents with   Follow-up   Urological history: 1. Urinary retention -episode in 2021 -passed TOV -did not show for follow up -PVR 0 mL  HPI: John Powell is a 65 y.o. male who presents today for recheck on his orchitis/epididymitis.   Urine culture was positive for corynebacterium species.    He is concerned that his scrotal swelling has not abated.  He states the pain has lessened.  Patient denies any modifying or aggravating factors.  Patient denies any gross hematuria, dysuria or suprapubic/flank pain.  Patient denies any fevers, chills, nausea or vomiting.    PMH: Past Medical History:  Diagnosis Date   CHF (congestive heart failure) (HCC)    COPD (chronic obstructive pulmonary disease) (HCC)    Coronary artery disease    COVID-05 Jul 2019   Diabetes mellitus without complication (HCC)    Hypertension    Sleep apnea     Surgical History: Past Surgical History:  Procedure Laterality Date   CHOLECYSTECTOMY      Home Medications:  Allergies as of 04/10/2021       Reactions   Lisinopril    Other reaction(s): Cough   Ranolazine         Medication List        Accurate as of April 10, 2021 11:59 PM. If you have any questions, ask your nurse or doctor.          STOP taking these medications    doxycycline 100 MG capsule Commonly known as: VIBRAMYCIN Stopped by: Kaiah Hosea, PA-C       TAKE these medications    albuterol 108 (90 Base) MCG/ACT inhaler Commonly known as: VENTOLIN HFA Inhale 2 puffs into the lungs every 4 (four) hours as needed for wheezing or shortness of breath.   allopurinol 100 MG tablet Commonly known as: ZYLOPRIM Take 100 mg by mouth 2 (two) times daily.   amiodarone 400 MG tablet Commonly known as: PACERONE Take 1 tablet (400 mg total) by  mouth daily.   amLODipine 5 MG tablet Commonly known as: NORVASC Take 2 tablets (10 mg total) by mouth daily.   apixaban 5 MG Tabs tablet Commonly known as: ELIQUIS TAKE ONE TABLET BY MOUTH EVERY 12 HOURS CAUTION BLOOD THINNER   aspirin 81 MG EC tablet Take 1 tablet (81 mg total) by mouth daily.   atorvastatin 40 MG tablet Commonly known as: LIPITOR Take 20 mg by mouth at bedtime.   carvedilol 25 MG tablet Commonly known as: COREG Take 25 mg by mouth 2 (two) times daily with a meal.   empagliflozin 25 MG Tabs tablet Commonly known as: JARDIANCE Take 0.5 tablets by mouth daily.   Entresto 97-103 MG Generic drug: sacubitril-valsartan Take 0.5 tablets by mouth 2 (two) times daily.   furosemide 20 MG tablet Commonly known as: LASIX Take 1 tablet (20 mg total) by mouth daily.   glipiZIDE 5 MG tablet Commonly known as: GLUCOTROL Take 5 mg by mouth 2 (two) times daily before a meal.   hydrALAZINE 50 MG tablet Commonly known as: APRESOLINE Take 1 tablet (50 mg total) by mouth 2 (two) times daily.   HYDROcodone-acetaminophen 5-325 MG tablet Commonly known as: Norco Take 1 tablet by mouth every 6 (six) hours as needed for moderate pain.   isosorbide mononitrate 60 MG  24 hr tablet Commonly known as: IMDUR TAKE ONE TABLET BY MOUTH ONCE EVERY DAY FOR HEART (DO NOT TAKE CIALIS, VIAGRA OR LEVITRA WHILE YOU ARE ON THIS MEDICATION).   levothyroxine 50 MCG tablet Commonly known as: SYNTHROID TAKE ONE TABLET BY MOUTH BEFORE BREAKFAST FOR THYROID   metFORMIN 1000 MG tablet Commonly known as: GLUCOPHAGE Take 1,000 mg by mouth 2 (two) times daily with a meal.   nitroGLYCERIN 0.4 MG SL tablet Commonly known as: NITROSTAT Place 1 tablet (0.4 mg total) under the tongue every 5 (five) minutes as needed for chest pain.   potassium chloride 20 MEQ packet Commonly known as: KLOR-CON Take 20 mEq by mouth daily.   spironolactone 25 MG tablet Commonly known as: ALDACTONE Take 0.5  tablets (12.5 mg total) by mouth daily.   SUPER B COMPLEX PO Take 1 tablet by mouth daily.        Allergies:  Allergies  Allergen Reactions   Lisinopril     Other reaction(s): Cough   Ranolazine     Family History: Family History  Problem Relation Age of Onset   Hypertension Mother     Social History:  reports that he has never smoked. He has never used smokeless tobacco. He reports that he does not currently use alcohol. He reports that he does not currently use drugs.  ROS: Pertinent ROS in HPI  Physical Exam: BP (!) 155/75   Pulse 71   Ht 5\' 11"  (1.803 m)   Wt (!) 352 lb (159.7 kg)   BMI 49.09 kg/m   Constitutional:  Well nourished. Alert and oriented, No acute distress. HEENT: [place] AT, mask in place.  Trachea midline Cardiovascular: No clubbing, cyanosis, or edema. Respiratory: Normal respiratory effort, no increased work of breathing. GU: No CVA tenderness.  No bladder fullness or masses.  Patient with uncircumcised phallus. Foreskin easily retracted  Urethral meatus is patent.  No penile discharge. No penile lesions or rashes. Scrotum without lesions, cysts, rashes and/or edema.  Right hemi-scrotum is enlarged, but no pain.  No fluctuance, crepitus or erythema noted.  Testicles are located scrotally bilaterally. No masses are appreciated in the testicles. Left epididymis is normal.  Right epididymis could not be palpated.  Neurologic: Grossly intact, no focal deficits, moving all 4 extremities. Psychiatric: Normal mood and affect.   Laboratory Data: N/A  Pertinent Imaging: N/A   Assessment & Plan:    1.  Right orchitis/epididymitis -obtain scrotal ultrasound for further evaluation of scrotal contents due to patient's concerns    Return in about 1 week (around 04/17/2021) for scrotal ultrasound report .  These notes generated with voice recognition software. I apologize for typographical errors.  13/07/2020, PA-C  [facility] [address]  [address] [place], [place] [phone]

## 2021-04-10 ENCOUNTER — Encounter: Payer: Self-pay | Admitting: Urology

## 2021-04-10 ENCOUNTER — Other Ambulatory Visit: Payer: Self-pay

## 2021-04-10 ENCOUNTER — Ambulatory Visit (INDEPENDENT_AMBULATORY_CARE_PROVIDER_SITE_OTHER): Payer: Medicare Other | Admitting: Urology

## 2021-04-10 VITALS — BP 155/75 | HR 71 | Ht 71.0 in | Wt 352.0 lb

## 2021-04-10 DIAGNOSIS — N453 Epididymo-orchitis: Secondary | ICD-10-CM

## 2021-04-10 NOTE — Patient Instructions (Signed)
-  Radiology scheduling [phone] 

## 2021-04-24 DIAGNOSIS — I10 Essential (primary) hypertension: Secondary | ICD-10-CM | POA: Diagnosis not present

## 2021-04-24 DIAGNOSIS — I480 Paroxysmal atrial fibrillation: Secondary | ICD-10-CM | POA: Diagnosis not present

## 2021-04-24 DIAGNOSIS — Z7901 Long term (current) use of anticoagulants: Secondary | ICD-10-CM | POA: Diagnosis not present

## 2021-04-25 DIAGNOSIS — Z7901 Long term (current) use of anticoagulants: Secondary | ICD-10-CM | POA: Diagnosis not present

## 2021-04-25 DIAGNOSIS — I480 Paroxysmal atrial fibrillation: Secondary | ICD-10-CM | POA: Diagnosis not present

## 2021-04-25 DIAGNOSIS — I10 Essential (primary) hypertension: Secondary | ICD-10-CM | POA: Diagnosis not present

## 2021-04-30 NOTE — Progress Notes (Incomplete)
04/30/21 2:14 PM   [name] 1955/11/28 [id number]  Referring provider:  [facility], [facility] [address] [place],  [place] [address] No chief complaint on file.   Urological history  Urinary retention  -episode in 2021 -passed TOV - Scrotal US  HPI: John Powell is a 65 y.o.male with a personal history of urinary retention, who present today for scrotal ultrasound report.       PMH: Past Medical History:  Diagnosis Date   CHF (congestive heart failure) (HCC)    COPD (chronic obstructive pulmonary disease) (HCC)    Coronary artery disease    COVID-05 Jul 2019   Diabetes mellitus without complication (HCC)    Hypertension    Sleep apnea     Surgical History: Past Surgical History:  Procedure Laterality Date   CHOLECYSTECTOMY      Home Medications:  Allergies as of 05/01/2021       Reactions   Lisinopril    Other reaction(s): Cough   Ranolazine         Medication List        Accurate as of April 30, 2021  2:14 PM. If you have any questions, ask your nurse or doctor.          albuterol 108 (90 Base) MCG/ACT inhaler Commonly known as: VENTOLIN HFA Inhale 2 puffs into the lungs every 4 (four) hours as needed for wheezing or shortness of breath.   allopurinol 100 MG tablet Commonly known as: ZYLOPRIM Take 100 mg by mouth 2 (two) times daily.   amiodarone 400 MG tablet Commonly known as: PACERONE Take 1 tablet (400 mg total) by mouth daily.   amLODipine 5 MG tablet Commonly known as: NORVASC Take 2 tablets (10 mg total) by mouth daily.   apixaban 5 MG Tabs tablet Commonly known as: ELIQUIS TAKE ONE TABLET BY MOUTH EVERY 12 HOURS CAUTION BLOOD THINNER   aspirin 81 MG EC tablet Take 1 tablet (81 mg total) by mouth daily.   atorvastatin 40 MG tablet Commonly known as: LIPITOR Take 20 mg by mouth at bedtime.   carvedilol 25 MG tablet Commonly known as: COREG Take 25 mg by mouth 2 (two) times daily with a meal.    empagliflozin 25 MG Tabs tablet Commonly known as: JARDIANCE Take 0.5 tablets by mouth daily.   Entresto 97-103 MG Generic drug: sacubitril-valsartan Take 0.5 tablets by mouth 2 (two) times daily.   furosemide 20 MG tablet Commonly known as: LASIX Take 1 tablet (20 mg total) by mouth daily.   glipiZIDE 5 MG tablet Commonly known as: GLUCOTROL Take 5 mg by mouth 2 (two) times daily before a meal.   hydrALAZINE 50 MG tablet Commonly known as: APRESOLINE Take 1 tablet (50 mg total) by mouth 2 (two) times daily.   HYDROcodone-acetaminophen 5-325 MG tablet Commonly known as: Norco Take 1 tablet by mouth every 6 (six) hours as needed for moderate pain.   isosorbide mononitrate 60 MG 24 hr tablet Commonly known as: IMDUR TAKE ONE TABLET BY MOUTH ONCE EVERY DAY FOR HEART (DO NOT TAKE CIALIS, VIAGRA OR LEVITRA WHILE YOU ARE ON THIS MEDICATION).   levothyroxine 50 MCG tablet Commonly known as: SYNTHROID TAKE ONE TABLET BY MOUTH BEFORE BREAKFAST FOR THYROID   metFORMIN 1000 MG tablet Commonly known as: GLUCOPHAGE Take 1,000 mg by mouth 2 (two) times daily with a meal.   nitroGLYCERIN 0.4 MG SL tablet Commonly known as: NITROSTAT Place 1 tablet (0.4 mg total)  under the tongue every 5 (five) minutes as needed for chest pain.   potassium chloride 20 MEQ packet Commonly known as: KLOR-CON Take 20 mEq by mouth daily.   spironolactone 25 MG tablet Commonly known as: ALDACTONE Take 0.5 tablets (12.5 mg total) by mouth daily.   SUPER B COMPLEX PO Take 1 tablet by mouth daily.        Allergies:  Allergies  Allergen Reactions   Lisinopril     Other reaction(s): Cough   Ranolazine     Family History: Family History  Problem Relation Age of Onset   Hypertension Mother     Social History:  reports that he has never smoked. He has never used smokeless tobacco. He reports that he does not currently use alcohol. He reports that he does not currently use  drugs.   Physical Exam: There were no vitals taken for this visit.  Constitutional:  Alert and oriented, No acute distress. HEENT: [place] AT, moist mucus membranes.  Trachea midline, no masses. Cardiovascular: No clubbing, cyanosis, or edema. Respiratory: Normal respiratory effort, no increased work of breathing. GI: Abdomen is soft, nontender, nondistended, no abdominal masses GU: No CVA tenderness Lymph: No cervical or inguinal lymphadenopathy. Skin: No rashes, bruises or suspicious lesions. Neurologic: Grossly intact, no focal deficits, moving all 4 extremities. Psychiatric: Normal mood and affect.  Laboratory Data:  Lab Results  Component Value Date   CREATININE 1.29 (H) 07/03/2019    Lab Results  Component Value Date   HGBA1C 5.8 (H) 03/13/2019    Urinalysis   Pertinent Imaging:   Assessment & Plan:     No follow-ups on file.  [facility] [address], [address] [place], [place] [address] [phone]  I,Kailey Littlejohn,acting as a scribe for [facility], PA-C.,have documented all relevant documentation on the behalf of John MCGOWAN, PA-C,as directed by  [facility], PA-C while in the presence of John MCGOWAN, PA-C.

## 2021-05-01 ENCOUNTER — Ambulatory Visit: Payer: Medicare Other | Admitting: Urology

## 2022-01-23 ENCOUNTER — Telehealth (HOSPITAL_BASED_OUTPATIENT_CLINIC_OR_DEPARTMENT_OTHER): Payer: Self-pay | Admitting: *Deleted

## 2022-01-23 NOTE — Telephone Encounter (Signed)
Left message for patient to call and schedule New Patient appoint per [facility] referral

## 2022-03-25 ENCOUNTER — Ambulatory Visit
Payer: No Typology Code available for payment source | Attending: Cardiovascular Disease | Admitting: Cardiovascular Disease

## 2022-03-25 ENCOUNTER — Encounter: Payer: Self-pay | Admitting: Cardiovascular Disease

## 2022-03-25 VITALS — BP 112/66 | HR 69 | Ht 72.0 in | Wt 356.6 lb

## 2022-03-25 DIAGNOSIS — I25118 Atherosclerotic heart disease of native coronary artery with other forms of angina pectoris: Secondary | ICD-10-CM | POA: Diagnosis not present

## 2022-03-25 DIAGNOSIS — Z95810 Presence of automatic (implantable) cardiac defibrillator: Secondary | ICD-10-CM

## 2022-03-25 DIAGNOSIS — I480 Paroxysmal atrial fibrillation: Secondary | ICD-10-CM

## 2022-03-25 DIAGNOSIS — I513 Intracardiac thrombosis, not elsewhere classified: Secondary | ICD-10-CM

## 2022-03-25 DIAGNOSIS — I420 Dilated cardiomyopathy: Secondary | ICD-10-CM | POA: Diagnosis not present

## 2022-03-25 DIAGNOSIS — I5022 Chronic systolic (congestive) heart failure: Secondary | ICD-10-CM

## 2022-03-25 DIAGNOSIS — I10 Essential (primary) hypertension: Secondary | ICD-10-CM

## 2022-03-25 DIAGNOSIS — E1122 Type 2 diabetes mellitus with diabetic chronic kidney disease: Secondary | ICD-10-CM

## 2022-03-25 DIAGNOSIS — N1832 Chronic kidney disease, stage 3b: Secondary | ICD-10-CM

## 2022-03-25 DIAGNOSIS — G4733 Obstructive sleep apnea (adult) (pediatric): Secondary | ICD-10-CM

## 2022-03-25 NOTE — Patient Instructions (Addendum)
Medication Instructions:  No changes  If you need a refill on your cardiac medications before your next appointment, please call your pharmacy.   Lab work: No new labs needed  Testing/Procedures:  Your physician has requested that you have an echocardiogram. Echocardiography is a painless test that uses sound waves to create images of your heart. It provides your doctor with information about the size and shape of your heart and how well your heart's chambers and valves are working. This procedure takes approximately one hour. There are no restrictions for this procedure. An IV will need to be started during your test to inject an image enhancing agent called Definity. This is done to obtain more optimal pictures of your heart. Therefore we ask that you do at least drink some water prior to coming in to hydrate your veins.     Follow-Up: At [facility], you and your health needs are our priority.  As part of our continuing mission to provide you with exceptional heart care, we have created designated Provider Care Teams.  These Care Teams include your primary Cardiologist (physician) and Advanced Practice Providers (APPs -  Physician Assistants and Nurse Practitioners) who all work together to provide you with the care you need, when you need it.  You will need a follow up appointment as needed  Providers on your designated Care Team:   Murray Hodgkins, NP Christell Faith, PA-C Cadence Kathlen Mody, [place]  COVID-19 Vaccine Information can be found at: [email or web address].uk For questions related to vaccine distribution or appointments, please email vaccine@ [email or web address] or call [phone].

## 2022-03-25 NOTE — Progress Notes (Signed)
Cardiology Office Note  Date:  03/25/2022   ID:  John Powell, DOB 10-Jul-1955, MRN [id number]  PCP:  [facility], [place]   Chief Complaint  Patient presents with   New Patient (Initial Visit)    LV Thrombus     HPI:  John Powell is a 66 year old gentleman with past medical history of Chronic systolic CHF AICD Chronic renal failure CAD (100% RCA, no cor revasc),  cardiomyopathy (EF ~ 50% in 2013, but has been as low as 20%),  DM,  HTN,  hyperlipidemia,  sleep apnea (uses CPAP consistently)  Prior x-ray small atrial fibrillation Morbid obesity Who presents by referral from Dorna Leitz at the [facility] for consultation of his intracardiac thrombus  Notes from the [place] indicating on echocardiogram they are unable to use Definity and evaluate for LV thrombus, referred for echo with Definity Prior cardiac history as below Followed at the [facility] in [place] admitted to [facility] on 03/14/19 with SOB. hs trop= 13. He was found to be in a-fib with RVR and runs of NSVT. He was started on  amiodarone and converted to NSR later in the hospitalization. Echo during that  hospitalization showed EF 20-25% and an apical thrombus. He was treated with IV  diuresis, started on apixaban for anti-coagulation, and started on GDMT with  spironolactone and Entresto.   hospitalized for COVID-19 at [place] in Jan 2021. He required oxygen  during that hospitalization.  apixaban was held due to hemoptysis which has  since resolved. taking ASA since DC but has not resume apixaban.  left and right heart cath in 11/2019  whcih showed CTO on RCA with collaterals and mod-severe pulm HTN with normal RA and PCWP. He was felt to have cardiomyopathy out of proportion to CAD  03/07/2021: John Powell was seen F2F. He underwent Holter monitor  since last visit which showed NSVT but no a-fib. Echo Aug 2022 showed EF 15- 20%. He denies CP, worsening SOB or edema.  Empagliflozin was added   John Powell underwent primary prevention ICD placement on 05/14/21.  Cardiac catheterization June 2021 Mid LAD 30%  CIRCUMFLEX (overall) Normal Mid RCA 100% CTO Distal vessel fills via right to right and left to right collaterals  Pressures (mmHg) RA mean: 2 RV: 63/ 3 PA: 60/ 19, mean 33 PCWP: 15 Mean BP: 113  Lab work reviewed Creatinine 1.9 BNP 27  EKG personally reviewed by myself on todays visit Nsr rate 69 rbbb  PMH:   has a past medical history of CHF (congestive heart failure) ([place]), COPD (chronic obstructive pulmonary disease) ([place]), Coronary artery disease, COVID-19, Diabetes mellitus without complication ([place]), Hypertension, and Sleep apnea.  PSH:    Past Surgical History:  Procedure Laterality Date   CHOLECYSTECTOMY      Current Outpatient Medications  Medication Sig Dispense Refill   allopurinol (ZYLOPRIM) 100 MG tablet Take 100 mg by mouth 2 (two) times daily.     amiodarone (PACERONE) 400 MG tablet Take 1 tablet (400 mg total) by mouth daily. 30 tablet 0   amLODipine (NORVASC) 5 MG tablet Take 2 tablets (10 mg total) by mouth daily. 30 tablet 1   apixaban (ELIQUIS) 5 MG TABS tablet TAKE ONE TABLET BY MOUTH EVERY 12 HOURS CAUTION BLOOD THINNER     atorvastatin (LIPITOR) 40 MG tablet Take 20 mg by mouth at bedtime.     B Complex-C (SUPER B COMPLEX PO) Take 1 tablet by mouth daily.  carvedilol (COREG) 25 MG tablet Take 25 mg by mouth 2 (two) times daily with a meal.     empagliflozin (JARDIANCE) 25 MG TABS tablet Take 0.5 tablets by mouth daily.     glipiZIDE (GLUCOTROL) 5 MG tablet Take 5 mg by mouth 2 (two) times daily before a meal.      hydrALAZINE (APRESOLINE) 50 MG tablet Take 1 tablet (50 mg total) by mouth 2 (two) times daily. 60 tablet 0   isosorbide mononitrate (IMDUR) 60 MG 24 hr tablet Take 30 mg by mouth in the morning and at bedtime.     levothyroxine (SYNTHROID) 50 MCG tablet TAKE ONE TABLET BY MOUTH BEFORE BREAKFAST  FOR THYROID     metFORMIN (GLUCOPHAGE) 1000 MG tablet Take 1,000 mg by mouth 2 (two) times daily with a meal.     nitroGLYCERIN (NITROSTAT) 0.4 MG SL tablet Place 1 tablet (0.4 mg total) under the tongue every 5 (five) minutes as needed for chest pain. 30 tablet 12   potassium chloride (KLOR-CON) 20 MEQ packet Take 20 mEq by mouth daily.     sacubitril-valsartan (ENTRESTO) 97-103 MG Take 0.5 tablets by mouth 2 (two) times daily.     spironolactone (ALDACTONE) 25 MG tablet Take 0.5 tablets (12.5 mg total) by mouth daily. 30 tablet 0   albuterol (VENTOLIN HFA) 108 (90 Base) MCG/ACT inhaler Inhale 2 puffs into the lungs every 4 (four) hours as needed for wheezing or shortness of breath. (Patient not taking: Reported on 03/25/2022) 6.7 g 0   aspirin EC 81 MG EC tablet Take 1 tablet (81 mg total) by mouth daily. (Patient not taking: Reported on 03/25/2022) 30 tablet 1   furosemide (LASIX) 20 MG tablet Take 1 tablet (20 mg total) by mouth daily. (Patient not taking: Reported on 03/25/2022) 30 tablet 0   HYDROcodone-acetaminophen (NORCO) 5-325 MG tablet Take 1 tablet by mouth every 6 (six) hours as needed for moderate pain. (Patient not taking: Reported on 03/25/2022) 15 tablet 0   No current facility-administered medications for this visit.    Allergies:   Lisinopril and Ranolazine   Social History:  The patient  reports that he has never smoked. He has never used smokeless tobacco. He reports that he does not currently use alcohol. He reports that he does not currently use drugs.   Family History:   family history includes Hypertension in his mother.    Review of Systems: Review of Systems  Constitutional: Negative.   HENT: Negative.    Respiratory: Negative.    Cardiovascular: Negative.   Gastrointestinal: Negative.   Musculoskeletal: Negative.   Neurological: Negative.   Psychiatric/Behavioral: Negative.    All other systems reviewed and are negative.   PHYSICAL EXAM: VS:  BP 112/66  (BP Location: Left Arm, Patient Position: Sitting, Cuff Size: Large)   Pulse 69   Ht 6' (1.829 m)   Wt (!) 356 lb 9.6 oz (161.8 kg)   SpO2 95%   BMI 48.36 kg/m  , BMI Body mass index is 48.36 kg/m. GEN: Well nourished, well developed, in no acute distress HEENT: normal Neck: no JVD, carotid bruits, or masses Cardiac: RRR; no murmurs, rubs, or gallops,no edema  Respiratory:  clear to auscultation bilaterally, normal work of breathing GI: soft, nontender, nondistended, + BS MS: no deformity or atrophy Skin: warm and dry, no rash Neuro:  Strength and sensation are intact Psych: euthymic mood, full affect   Recent Labs: No results found for requested labs within last 365 days.  Lipid Panel Lab Results  Component Value Date   TRIG 52 06/30/2019      Wt Readings from Last 3 Encounters:  03/25/22 (!) 356 lb 9.6 oz (161.8 kg)  04/10/21 (!) 352 lb (159.7 kg)  03/27/21 (!) 352 lb (159.7 kg)       ASSESSMENT AND PLAN:  Problem List Items Addressed This Visit       Cardiology Problems   Coronary artery disease   Chronic systolic CHF (congestive heart failure) (HCC)   A-fib (HCC)     Other   Type II diabetes mellitus with renal manifestations (HCC)   Other Visit Diagnoses     Dilated cardiomyopathy (HCC)    -  Primary   Relevant Orders   EKG 12-Lead   ICD (implantable cardioverter-defibrillator) in place       LV (left ventricular) mural thrombus       Essential hypertension       Obstructive sleep apnea          Coronary artery disease with stable angina Denies anginal symptoms, prior cardiac catheterization results reviewed Occluded RCA with collaterals On Lipitor daily, managed by cardiology at the [facility]  Ischemic and nonischemic cardiomyopathy Long history of dilated cardiomyopathy dating back at least 3 years Prior documentation of LV thrombus in 2020 in the setting of severely depressed ejection fraction and A-fib Has been maintained on Eliquis At [facility]  request, echocardiogram ordered with Definity for further evaluation of LV apical thrombus On good cardiac regiment including hydralazine, isosorbide, carvedilol, Entresto, spironolactone, Jardiance Blood pressure well controlled, appears euvolemic ICD in place, followed by [facility] cardiology  Diabetes type 2 with nephropathy/morbid obesity We have encouraged continued exercise, careful diet management in an effort to lose weight. -Consideration of Ozempic  Paroxysmal atrial fibrillation On amiodarone 400 daily, carvedilol, Eliquis Maintaining normal sinus rhythm    Total encounter time more than 60 minutes  Greater than 50% was spent in counseling and coordination of care with the patient    Signed, Dossie Arbour, M.D., Ph.D. [facility] [place], [place] [phone]

## 2022-05-14 ENCOUNTER — Other Ambulatory Visit: Payer: No Typology Code available for payment source

## 2022-06-27 ENCOUNTER — Ambulatory Visit: Payer: Medicare Other | Attending: Cardiovascular Disease

## 2022-06-27 DIAGNOSIS — I420 Dilated cardiomyopathy: Secondary | ICD-10-CM

## 2022-06-27 DIAGNOSIS — I480 Paroxysmal atrial fibrillation: Secondary | ICD-10-CM

## 2022-06-27 DIAGNOSIS — I10 Essential (primary) hypertension: Secondary | ICD-10-CM | POA: Diagnosis not present

## 2022-06-27 DIAGNOSIS — I5022 Chronic systolic (congestive) heart failure: Secondary | ICD-10-CM

## 2022-06-27 DIAGNOSIS — N1832 Chronic kidney disease, stage 3b: Secondary | ICD-10-CM

## 2022-06-27 DIAGNOSIS — Z95810 Presence of automatic (implantable) cardiac defibrillator: Secondary | ICD-10-CM

## 2022-06-27 DIAGNOSIS — G4733 Obstructive sleep apnea (adult) (pediatric): Secondary | ICD-10-CM

## 2022-06-27 DIAGNOSIS — E1122 Type 2 diabetes mellitus with diabetic chronic kidney disease: Secondary | ICD-10-CM

## 2022-06-27 DIAGNOSIS — I513 Intracardiac thrombosis, not elsewhere classified: Secondary | ICD-10-CM

## 2022-06-27 DIAGNOSIS — I25118 Atherosclerotic heart disease of native coronary artery with other forms of angina pectoris: Secondary | ICD-10-CM | POA: Diagnosis not present

## 2022-06-27 MED ORDER — PERFLUTREN LIPID MICROSPHERE
1.0000 mL | INTRAVENOUS | Status: AC | PRN
  Administered 2022-06-27: 2 mL via INTRAVENOUS

## 2022-06-28 LAB — ECHOCARDIOGRAM COMPLETE
AR max vel: 3.07 cm2
AV Area VTI: 3.4 cm2
AV Area mean vel: 3.4 cm2
AV Mean grad: 7 mmHg
AV Peak grad: 13.5 mmHg
Ao pk vel: 1.84 m/s
Area-P 1/2: 2.43 cm2
S' Lateral: 5.6 cm
Single Plane A4C EF: 49.3 %

## 2023-04-08 DIAGNOSIS — H43812 Vitreous degeneration, left eye: Secondary | ICD-10-CM | POA: Diagnosis not present

## 2023-04-08 DIAGNOSIS — H40003 Preglaucoma, unspecified, bilateral: Secondary | ICD-10-CM | POA: Diagnosis not present

## 2023-04-08 DIAGNOSIS — E119 Type 2 diabetes mellitus without complications: Secondary | ICD-10-CM | POA: Diagnosis not present

## 2023-04-08 DIAGNOSIS — H2513 Age-related nuclear cataract, bilateral: Secondary | ICD-10-CM | POA: Diagnosis not present
